# Patient Record
Sex: Male | Born: 1950 | Hispanic: No | Marital: Single | Smoking: Former smoker
Health system: Southern US, Community
[De-identification: ages and names within clinical notes are randomized; demographics above are authoritative.]

## PROBLEM LIST (undated history)

## (undated) DIAGNOSIS — J45909 Unspecified asthma, uncomplicated: Secondary | ICD-10-CM

## (undated) DIAGNOSIS — M1990 Unspecified osteoarthritis, unspecified site: Secondary | ICD-10-CM

## (undated) HISTORY — PX: ADENOIDECTOMY: SUR15

## (undated) HISTORY — DX: Unspecified osteoarthritis, unspecified site: M19.90

## (undated) HISTORY — PX: WISDOM TOOTH EXTRACTION: SHX21

## (undated) HISTORY — DX: Unspecified asthma, uncomplicated: J45.909

## (undated) HISTORY — PX: TONSILLECTOMY: SUR1361

---

## 2004-09-09 ENCOUNTER — Ambulatory Visit: Payer: Self-pay | Admitting: Family Medicine

## 2004-09-23 ENCOUNTER — Ambulatory Visit: Payer: Self-pay | Admitting: Family Medicine

## 2004-10-02 ENCOUNTER — Ambulatory Visit: Payer: Self-pay | Admitting: Family Medicine

## 2004-10-08 ENCOUNTER — Ambulatory Visit: Payer: Self-pay | Admitting: Gastroenterology

## 2005-08-23 ENCOUNTER — Ambulatory Visit: Payer: Self-pay | Admitting: Family Medicine

## 2009-02-13 ENCOUNTER — Encounter: Admission: RE | Admit: 2009-02-13 | Discharge: 2009-02-13 | Payer: Self-pay | Admitting: Chiropractic Medicine

## 2016-05-28 HISTORY — PX: DENTAL SURGERY: SHX609

## 2017-03-18 ENCOUNTER — Telehealth: Payer: Self-pay | Admitting: General Practice

## 2017-03-18 NOTE — Telephone Encounter (Signed)
Pt would like to know if you will accept him back as a pt? Pt cannot come in until after Jan 2019. Has not seen you since 2006. Pt is medicare.

## 2017-03-18 NOTE — Telephone Encounter (Signed)
Yes I can see him again

## 2017-03-24 NOTE — Telephone Encounter (Signed)
Pt has been scheduled.  °

## 2017-06-28 ENCOUNTER — Encounter: Payer: Self-pay | Admitting: Family Medicine

## 2017-06-28 ENCOUNTER — Ambulatory Visit: Payer: Medicare PPO | Admitting: Family Medicine

## 2017-06-28 VITALS — BP 144/98 | HR 77 | Temp 98.6°F | Resp 18 | Ht 76.0 in | Wt 311.2 lb

## 2017-06-28 DIAGNOSIS — N138 Other obstructive and reflux uropathy: Secondary | ICD-10-CM | POA: Diagnosis not present

## 2017-06-28 DIAGNOSIS — Z0000 Encounter for general adult medical examination without abnormal findings: Secondary | ICD-10-CM | POA: Diagnosis not present

## 2017-06-28 DIAGNOSIS — N401 Enlarged prostate with lower urinary tract symptoms: Secondary | ICD-10-CM | POA: Diagnosis not present

## 2017-06-28 DIAGNOSIS — I10 Essential (primary) hypertension: Secondary | ICD-10-CM | POA: Diagnosis not present

## 2017-06-29 ENCOUNTER — Other Ambulatory Visit (INDEPENDENT_AMBULATORY_CARE_PROVIDER_SITE_OTHER): Payer: Medicare PPO

## 2017-06-29 DIAGNOSIS — N138 Other obstructive and reflux uropathy: Secondary | ICD-10-CM | POA: Diagnosis not present

## 2017-06-29 DIAGNOSIS — N401 Enlarged prostate with lower urinary tract symptoms: Secondary | ICD-10-CM

## 2017-06-29 DIAGNOSIS — I10 Essential (primary) hypertension: Secondary | ICD-10-CM | POA: Diagnosis not present

## 2017-06-29 LAB — CBC WITH DIFFERENTIAL/PLATELET
Basophils Absolute: 0.1 10*3/uL (ref 0.0–0.1)
Basophils Relative: 1.5 % (ref 0.0–3.0)
Eosinophils Absolute: 0.1 10*3/uL (ref 0.0–0.7)
Eosinophils Relative: 1.7 % (ref 0.0–5.0)
HCT: 48 % (ref 39.0–52.0)
Hemoglobin: 16.2 g/dL (ref 13.0–17.0)
Lymphocytes Relative: 32.8 % (ref 12.0–46.0)
Lymphs Abs: 2.8 10*3/uL (ref 0.7–4.0)
MCHC: 33.7 g/dL (ref 30.0–36.0)
MCV: 93.4 fl (ref 78.0–100.0)
Monocytes Absolute: 0.7 10*3/uL (ref 0.1–1.0)
Monocytes Relative: 7.9 % (ref 3.0–12.0)
Neutro Abs: 4.7 10*3/uL (ref 1.4–7.7)
Neutrophils Relative %: 56.1 % (ref 43.0–77.0)
Platelets: 285 10*3/uL (ref 150.0–400.0)
RBC: 5.15 Mil/uL (ref 4.22–5.81)
RDW: 13.7 % (ref 11.5–15.5)
WBC: 8.4 10*3/uL (ref 4.0–10.5)

## 2017-06-29 LAB — POC URINALSYSI DIPSTICK (AUTOMATED)
Bilirubin, UA: NEGATIVE
Blood, UA: NEGATIVE
GLUCOSE UA: NEGATIVE
Ketones, UA: NEGATIVE
LEUKOCYTES UA: NEGATIVE
Nitrite, UA: NEGATIVE
Protein, UA: NEGATIVE
Spec Grav, UA: 1.025 (ref 1.010–1.025)
UROBILINOGEN UA: 0.2 U/dL
pH, UA: 6 (ref 5.0–8.0)

## 2017-06-29 LAB — BASIC METABOLIC PANEL
BUN: 20 mg/dL (ref 6–23)
CHLORIDE: 103 meq/L (ref 96–112)
CO2: 22 meq/L (ref 19–32)
CREATININE: 1.25 mg/dL (ref 0.40–1.50)
Calcium: 9.4 mg/dL (ref 8.4–10.5)
GFR: 61.48 mL/min (ref >60.00)
Glucose, Bld: 116 mg/dL — ABNORMAL HIGH (ref 70–99)
Potassium: 4.5 mEq/L (ref 3.5–5.1)
Sodium: 139 mEq/L (ref 135–145)

## 2017-06-29 LAB — HEPATIC FUNCTION PANEL
ALK PHOS: 78 U/L (ref 39–117)
ALT: 13 U/L (ref 0–53)
AST: 13 U/L (ref 0–37)
Albumin: 4.3 g/dL (ref 3.5–5.2)
Bilirubin, Direct: 0.2 mg/dL (ref 0.0–0.3)
Total Bilirubin: 1 mg/dL (ref 0.2–1.2)
Total Protein: 7.1 g/dL (ref 6.0–8.3)

## 2017-06-29 LAB — LIPID PANEL
CHOL/HDL RATIO: 3
Cholesterol: 182 mg/dL (ref 0–200)
HDL: 60.9 mg/dL (ref >39.00)
LDL CALC: 99 mg/dL (ref 0–99)
NonHDL: 120.92
TRIGLYCERIDES: 109 mg/dL (ref 0.0–149.0)
VLDL: 21.8 mg/dL (ref 0.0–40.0)

## 2017-06-29 LAB — TSH: TSH: 2.05 u[IU]/mL (ref 0.35–4.50)

## 2017-06-29 LAB — PSA: PSA: 2.31 ng/mL (ref 0.10–4.00)

## 2017-06-30 NOTE — Progress Notes (Signed)
   Subjective:    Patient ID: Jesse Chase, male    DOB: [birth year], 66 y.o.   MRN: [id number]  HPI Here to re-establish and for a health review.  We last saw him in 2007. He has been doing well. He has no specific complaints. He is past due for a colonoscopy.    Review of Systems  Constitutional: Negative.   HENT: Negative.   Eyes: Negative.   Respiratory: Negative.   Cardiovascular: Negative.   Gastrointestinal: Negative.   Genitourinary: Negative.   Musculoskeletal: Negative.   Skin: Negative.   Neurological: Negative.   Psychiatric/Behavioral: Negative.        Objective:   Physical Exam  Constitutional: He is oriented to person, place, and time. No distress.  Morbidly obese   HENT:  Head: Normocephalic and atraumatic.  Right Ear: External ear normal.  Left Ear: External ear normal.  Nose: Nose normal.  Mouth/Throat: Oropharynx is clear and moist. No oropharyngeal exudate.  Eyes: Conjunctivae and EOM are normal. Pupils are equal, round, and reactive to light. Right eye exhibits no discharge. Left eye exhibits no discharge. No scleral icterus.  Neck: Neck supple. No JVD present. No tracheal deviation present. No thyromegaly present.  Cardiovascular: Normal rate, regular rhythm, normal heart sounds and intact distal pulses. Exam reveals no gallop and no friction rub.  No murmur heard. Pulmonary/Chest: Effort normal and breath sounds normal. No respiratory distress. He has no wheezes. He has no rales. He exhibits no tenderness.  Abdominal: Soft. Bowel sounds are normal. He exhibits no distension and no mass. There is no tenderness. There is no rebound and no guarding.  Genitourinary: Rectum normal, prostate normal and penis normal. Rectal exam shows guaiac negative stool. No penile tenderness.  Musculoskeletal: Normal range of motion. He exhibits no edema or tenderness.  Lymphadenopathy:    He has no cervical adenopathy.  Neurological: He is alert and oriented to person,  place, and time. He has normal reflexes. No cranial nerve deficit. He exhibits normal muscle tone. Coordination normal.  Skin: Skin is warm and dry. No rash noted. He is not diaphoretic. No erythema. No pallor.  Psychiatric: He has a normal mood and affect. His behavior is normal. Judgment and thought content normal.          Assessment & Plan:  Re-establish visit and health review. . We discussed diet and exercise. Get fasting labs. Set up a colonoscopy.  Alysia Penna, MD

## 2017-07-22 ENCOUNTER — Telehealth: Payer: Self-pay | Admitting: Family Medicine

## 2017-07-22 NOTE — Telephone Encounter (Signed)
Copied from [place]. Topic: Quick Communication - Lab Results >> Jul 22, 2017 11:43 AM Burnis Medin, NT wrote: Patient called and said he had some lab work done and hadn't heard back . Pt would like a call back if results were in.

## 2017-07-22 NOTE — Telephone Encounter (Signed)
Informed pt that labs were all normal except his glucose is mildly elevated. Watch the carb intake. Pt verbalized understanding.

## 2017-07-22 NOTE — Telephone Encounter (Signed)
His labs were all normal except his glucose is mildly elevated. Watch the carb intake

## 2017-07-22 NOTE — Telephone Encounter (Signed)
Please advise 

## 2017-08-01 ENCOUNTER — Encounter: Payer: Self-pay | Admitting: Family Medicine

## 2017-10-13 ENCOUNTER — Telehealth: Payer: Self-pay | Admitting: Family Medicine

## 2017-10-13 DIAGNOSIS — M25561 Pain in right knee: Principal | ICD-10-CM

## 2017-10-13 DIAGNOSIS — G8929 Other chronic pain: Secondary | ICD-10-CM

## 2017-10-13 NOTE — Telephone Encounter (Signed)
Copied from [place] [id number]. Topic: Quick Communication - See Telephone Encounter >> Oct 13, 2017  4:17 PM [name], NT wrote: CRM for notification. See Telephone encounter for: 10/13/17. Patient calling and states that he was seen and had some preventative care done(06/28/17). Would like a copy of everything that was done, so he can send it to his life insurance and they can get him the preventative care check that he receives. CB#:[phone]

## 2017-10-13 NOTE — Telephone Encounter (Signed)
Copied from [place] [id number]. Topic: Referral - Request >> Oct 13, 2017  4:25 PM [name], [name] wrote: Reason for CRM: Patient states that when he was seen last, there was talk that an orthopedic referral would be done. Patient states that he is needing a referral done to orthopedics for his right knee. Please advise.

## 2017-10-14 NOTE — Telephone Encounter (Signed)
Sent to PCP to set up referral  

## 2017-10-14 NOTE — Telephone Encounter (Signed)
Called and spoke with pt. Have placed lab work and last OV summary to be mailed to pt as requested.

## 2017-10-17 NOTE — Telephone Encounter (Signed)
Referral was done  

## 2017-10-18 NOTE — Telephone Encounter (Signed)
Noted  

## 2017-10-20 ENCOUNTER — Encounter: Payer: Self-pay | Admitting: Gastroenterology

## 2017-10-25 ENCOUNTER — Ambulatory Visit (INDEPENDENT_AMBULATORY_CARE_PROVIDER_SITE_OTHER): Payer: Self-pay | Admitting: Orthopedic Surgery

## 2017-11-01 ENCOUNTER — Ambulatory Visit (INDEPENDENT_AMBULATORY_CARE_PROVIDER_SITE_OTHER): Payer: Medicare PPO | Admitting: Orthopaedic Surgery

## 2017-11-01 ENCOUNTER — Encounter (INDEPENDENT_AMBULATORY_CARE_PROVIDER_SITE_OTHER): Payer: Self-pay | Admitting: Orthopaedic Surgery

## 2017-11-01 ENCOUNTER — Ambulatory Visit (INDEPENDENT_AMBULATORY_CARE_PROVIDER_SITE_OTHER): Payer: Medicare PPO

## 2017-11-01 DIAGNOSIS — G8929 Other chronic pain: Secondary | ICD-10-CM | POA: Diagnosis not present

## 2017-11-01 DIAGNOSIS — M25561 Pain in right knee: Secondary | ICD-10-CM | POA: Diagnosis not present

## 2017-11-01 MED ORDER — BUPIVACAINE HCL 0.5 % IJ SOLN
2.0000 mL | INTRAMUSCULAR | Status: AC | PRN
  Administered 2017-11-01: 2 mL via INTRA_ARTICULAR

## 2017-11-01 MED ORDER — LIDOCAINE HCL 1 % IJ SOLN
2.0000 mL | INTRAMUSCULAR | Status: AC | PRN
  Administered 2017-11-01: 2 mL

## 2017-11-01 MED ORDER — METHYLPREDNISOLONE ACETATE 40 MG/ML IJ SUSP
40.0000 mg | INTRAMUSCULAR | Status: AC | PRN
  Administered 2017-11-01: 40 mg via INTRA_ARTICULAR

## 2017-11-01 MED ORDER — DICLOFENAC SODIUM 1 % TD GEL: 2.0000 g | Freq: Four times a day (QID) | TRANSDERMAL | 5 refills | Status: AC

## 2017-11-01 NOTE — Progress Notes (Signed)
Office Visit Note   Patient: Jesse Chase           Date of Birth: Aug 02, 1950           MRN: [id number] Visit Date: 11/01/2017              Requested by: Laurey Morale, MD [place], [place] [id number] PCP: Laurey Morale, MD   Assessment & Plan: Visit Diagnoses:  1. Chronic pain of right knee     Plan: Overall impression is right knee degenerative joint disease with narrowing of the medial compartment.  Cortisone injection performed today.  Prescription for Voltaren gel.  Patient instructed to contact us if he does not improve.    Follow-Up Instructions: Return if symptoms worsen or fail to improve.   Orders:  Orders Placed This Encounter  Procedures  . XR KNEE 3 VIEW RIGHT   Meds ordered this encounter  Medications  . diclofenac sodium (VOLTAREN) 1 % GEL    Sig: Apply 2 g topically 4 (four) times daily.    Dispense:  1 Tube    Refill:  5      Procedures: Large Joint Inj: R knee on 11/01/2017 3:36 PM Indications: pain Details: 22 G needle  Arthrogram: No  Medications: 40 mg methylPREDNISolone acetate 40 MG/ML; 2 mL lidocaine 1 %; 2 mL bupivacaine 0.5 % Consent was given by the patient. Patient was prepped and draped in the usual sterile fashion.       Clinical Data: No additional findings.   Subjective: Chief Complaint  Patient presents with  . Right Knee - Pain    Patient is a very pleasant 67 year old gentleman comes in with 2-year history of right knee pain that started when he fell directly on the knee.  He now has anteromedial joint line pain.  Denies any mechanical symptoms.  Denies any swelling.   Review of Systems  Constitutional: Negative.   All other systems reviewed and are negative.    Objective: Vital Signs: There were no vitals taken for this visit.  Physical Exam  Constitutional: He is oriented to person, place, and time. He appears well-developed and well-nourished.  HENT:  Head: Normocephalic and  atraumatic.  Eyes: Pupils are equal, round, and reactive to light.  Neck: Neck supple.  Pulmonary/Chest: Effort normal.  Abdominal: Soft.  Musculoskeletal: Normal range of motion.  Neurological: He is alert and oriented to person, place, and time.  Skin: Skin is warm.  Psychiatric: He has a normal mood and affect. His behavior is normal. Judgment and thought content normal.  Nursing note and vitals reviewed.   Ortho Exam Right knee exam shows no joint effusion.  Collaterals and cruciates are stable.  No significant joint line tenderness. Specialty Comments:  No specialty comments available.  Imaging: Xr Knee 3 View Right  Result Date: 11/01/2017 Medial compartment joint space narrowing >50%.    PMFS History: There are no active problems to display for this patient.  Past Medical History:  Diagnosis Date  . Asthma    Childhood "bronchial asthma"    History reviewed. No pertinent family history.  Past Surgical History:  Procedure Laterality Date  . ADENOIDECTOMY    . DENTAL SURGERY  05/28/2016   Bone graft from jaw for dental implants  . TONSILLECTOMY    . WISDOM TOOTH EXTRACTION     Social History   Occupational History  . Not on file  Tobacco Use  . Smoking status: Former Smoker  Last attempt to quit: 06/08/1991    Years since quitting: 26.4  . Smokeless tobacco: Never Used  Substance and Sexual Activity  . Alcohol use: Not on file  . Drug use: No  . Sexual activity: Yes

## 2017-12-06 ENCOUNTER — Ambulatory Visit (AMBULATORY_SURGERY_CENTER): Payer: Self-pay

## 2017-12-06 ENCOUNTER — Other Ambulatory Visit: Payer: Self-pay

## 2017-12-06 VITALS — Ht 76.0 in | Wt 302.8 lb

## 2017-12-06 DIAGNOSIS — Z1211 Encounter for screening for malignant neoplasm of colon: Secondary | ICD-10-CM

## 2017-12-06 MED ORDER — NA SULFATE-K SULFATE-MG SULF 17.5-3.13-1.6 GM/177ML PO SOLN: 1.0000 | Freq: Once | ORAL | 0 refills | Status: AC

## 2017-12-06 NOTE — Progress Notes (Signed)
Denies allergies to eggs or soy products. Denies complication of anesthesia or sedation. Denies use of weight loss medication. Denies use of O2.   Emmi instructions declined.  

## 2017-12-12 ENCOUNTER — Telehealth: Payer: Self-pay | Admitting: Gastroenterology

## 2017-12-12 NOTE — Telephone Encounter (Signed)
Spoke with patient. He states Supre cost $110 and he cannot afford that, he request alterative. I did explain to him the reason our Doctor uses this prep. Offered patient Suprep sample, he states he will come here today before 5pm on the 4 th floor and pick this up. Suprep sample (1 kit take as directed-lot # [id number]. 5/21) left at the front desk for the patient.

## 2017-12-12 NOTE — Telephone Encounter (Signed)
Called patient. No answer, left message for patient to call us back today before 5 pm.

## 2017-12-20 ENCOUNTER — Encounter: Payer: Self-pay | Admitting: Gastroenterology

## 2017-12-20 ENCOUNTER — Ambulatory Visit (AMBULATORY_SURGERY_CENTER): Payer: Medicare PPO | Admitting: Gastroenterology

## 2017-12-20 VITALS — BP 131/84 | HR 76 | Temp 96.6°F | Resp 22 | Ht 76.0 in | Wt 302.0 lb

## 2017-12-20 DIAGNOSIS — D124 Benign neoplasm of descending colon: Secondary | ICD-10-CM | POA: Diagnosis not present

## 2017-12-20 DIAGNOSIS — D125 Benign neoplasm of sigmoid colon: Secondary | ICD-10-CM

## 2017-12-20 DIAGNOSIS — K635 Polyp of colon: Secondary | ICD-10-CM

## 2017-12-20 DIAGNOSIS — Z1211 Encounter for screening for malignant neoplasm of colon: Secondary | ICD-10-CM

## 2017-12-20 MED ORDER — SODIUM CHLORIDE 0.9 % IV SOLN: 500.0000 mL | Freq: Once | INTRAVENOUS | Status: AC

## 2017-12-20 NOTE — Progress Notes (Signed)
Report to PACU, RN, vss, BBS= Clear.  

## 2017-12-20 NOTE — Patient Instructions (Signed)
YOU HAD AN ENDOSCOPIC PROCEDURE TODAY AT [facility]:   Refer to the procedure report that was given to you for any specific questions about what was found during the examination.  If the procedure report does not answer your questions, please call your gastroenterologist to clarify.  If you requested that your care partner not be given the details of your procedure findings, then the procedure report has been included in a sealed envelope for you to review at your convenience later.  YOU SHOULD EXPECT: Some feelings of bloating in the abdomen. Passage of more gas than usual.  Walking can help get rid of the air that was put into your GI tract during the procedure and reduce the bloating. If you had a lower endoscopy (such as a colonoscopy or flexible sigmoidoscopy) you may notice spotting of blood in your stool or on the toilet paper. If you underwent a bowel prep for your procedure, you may not have a normal bowel movement for a few days.  Please Note:  You might notice some irritation and congestion in your nose or some drainage.  This is from the oxygen used during your procedure.  There is no need for concern and it should clear up in a day or so.  SYMPTOMS TO REPORT IMMEDIATELY:   Following lower endoscopy (colonoscopy or flexible sigmoidoscopy):  Excessive amounts of blood in the stool  Significant tenderness or worsening of abdominal pains  Swelling of the abdomen that is new, acute  Fever of 100F or higher   For urgent or emergent issues, a gastroenterologist can be reached at any hour by calling [phone].   DIET:  We do recommend a small meal at first, but then you may proceed to your regular diet.  Drink plenty of fluids but you should avoid alcoholic beverages for 24 hours. We suggest a high fiber diet, and drink plenty of water.  ACTIVITY:  You should plan to take it easy for the rest of today and you should NOT DRIVE or use heavy machinery until tomorrow  (because of the sedation medicines used during the test).    FOLLOW UP: Our staff will call the number listed on your records the next business day following your procedure to check on you and address any questions or concerns that you may have regarding the information given to you following your procedure. If we do not reach you, we will leave a message.  However, if you are feeling well and you are not experiencing any problems, there is no need to return our call.  We will assume that you have returned to your regular daily activities without incident.  If any biopsies were taken you will be contacted by phone or by letter within the next 1-3 weeks.  Please call us at [phone] if you have not heard about the biopsies in 3 weeks.    SIGNATURES/CONFIDENTIALITY: You and/or your care partner have signed paperwork which will be entered into your electronic medical record.  These signatures attest to the fact that that the information above on your After Visit Summary has been reviewed and is understood.  Full responsibility of the confidentiality of this discharge information lies with you and/or your care-partner.  Read all of the handouts given to you by your recovery room nurse.

## 2017-12-20 NOTE — Progress Notes (Signed)
Pt's states no medical or surgical changes since previsit or office visit. 

## 2017-12-20 NOTE — Progress Notes (Signed)
Patient states that he is "not coming back for any more tests.   He states that he is not changing his diet, and he will be drinking alcohol whenever he wants.

## 2017-12-20 NOTE — Progress Notes (Signed)
Called to room to assist during endoscopic procedure.  Patient ID and intended procedure confirmed with present staff. Received instructions for my participation in the procedure from the performing physician.  

## 2017-12-20 NOTE — Op Note (Signed)
[facility] Patient Name: Jesse Chase Procedure Date: 12/20/2017 1:23 PM MRN: [id number] Endoscopist: Ladene Artist , MD Age: 67 Referring MD:  Date of Birth: 1951-01-07 Gender: Male Account #: [id number] Procedure:                Colonoscopy Indications:              Screening for colorectal malignant neoplasm Medicines:                Monitored Anesthesia Care Procedure:                Pre-Anesthesia Assessment:                           - Prior to the procedure, a History and Physical                            was performed, and patient medications and                            allergies were reviewed. The patient's tolerance of                            previous anesthesia was also reviewed. The risks                            and benefits of the procedure and the sedation                            options and risks were discussed with the patient.                            All questions were answered, and informed consent                            was obtained. Prior Anticoagulants: The patient has                            taken no previous anticoagulant or antiplatelet                            agents. ASA Grade Assessment: II - A patient with                            mild systemic disease. After reviewing the risks                            and benefits, the patient was deemed in                            satisfactory condition to undergo the procedure.                           After obtaining informed consent, the colonoscope  was passed under direct vision. Throughout the                            procedure, the patient's blood pressure, pulse, and                            oxygen saturations were monitored continuously. The                            Colonoscope was introduced through the anus and                            advanced to the the cecum, identified by                            appendiceal orifice and  ileocecal valve. The                            ileocecal valve, appendiceal orifice, and rectum                            were photographed. The quality of the bowel                            preparation was good. The colonoscopy was performed                            without difficulty. The patient tolerated the                            procedure well. Scope In: 1:27:40 PM Scope Out: 1:45:24 PM Scope Withdrawal Time: 0 hours 13 minutes 15 seconds  Total Procedure Duration: 0 hours 17 minutes 44 seconds  Findings:                 The perianal and digital rectal examinations were                            normal.                           Two sessile polyps were found in the sigmoid colon                            and descending colon. The polyps were 7 to 8 mm in                            size. These polyps were removed with a cold snare.                            Resection and retrieval were complete.                           Multiple medium-mouthed diverticula were found in  the left colon. There was narrowing of the colon in                            association with the diverticular opening. There                            was evidence of an impacted diverticulum. There was                            no evidence of diverticular bleeding.                           Internal hemorrhoids were found during                            retroflexion. The hemorrhoids were small and Grade                            I (internal hemorrhoids that do not prolapse).                           The exam was otherwise without abnormality on                            direct and retroflexion views. Complications:            No immediate complications. Estimated blood loss:                            None. Estimated Blood Loss:     Estimated blood loss: none. Impression:               - Two 7 to 8 mm polyps in the sigmoid colon and in                            the  descending colon, removed with a cold snare.                            Resected and retrieved.                           - Moderate diverticulosis in the left colon.                           - Small internal hemorrhoids. Recommendation:           - Repeat colonoscopy in 5 years for surveillance if                            polyp(s) are precancerous, otherwise 10 years.                           - Patient has a contact number available for  emergencies. The signs and symptoms of potential                            delayed complications were discussed with the                            patient. Return to normal activities tomorrow.                            Written discharge instructions were provided to the                            patient.                           - High fiber diet.                           - Continue present medications.                           - Await pathology results. Ladene Artist, MD 12/20/2017 1:51:26 PM This report has been signed electronically.

## 2017-12-21 ENCOUNTER — Telehealth: Payer: Self-pay | Admitting: *Deleted

## 2017-12-21 ENCOUNTER — Telehealth: Payer: Self-pay

## 2017-12-21 NOTE — Telephone Encounter (Signed)
  Follow up Call-  Call back number 12/20/2017  Post procedure Call Back phone  # [phone]  Permission to leave phone message Yes  Some recent data might be hidden     Patient questions:  Do you have a fever, pain , or abdominal swelling? No. Pain Score  0 *  Have you tolerated food without any problems? Yes.    Have you been able to return to your normal activities? Yes.    Do you have any questions about your discharge instructions: Diet   No. Medications  No. Follow up visit  No.  Do you have questions or concerns about your Care? No.  Actions: * If pain score is 4 or above: No action needed, pain <4.

## 2017-12-21 NOTE — Telephone Encounter (Signed)
Attempted to reach patient for post-procedure f/u call. No answer. Left message that we will make another attempt to reach him later today and for him to please not hesitate to call us if he has any questions/concerns regarding his care. 

## 2017-12-27 ENCOUNTER — Encounter: Payer: Self-pay | Admitting: Gastroenterology

## 2018-02-10 DIAGNOSIS — M79675 Pain in left toe(s): Secondary | ICD-10-CM | POA: Diagnosis not present

## 2018-02-10 DIAGNOSIS — B079 Viral wart, unspecified: Secondary | ICD-10-CM | POA: Diagnosis not present

## 2018-02-10 DIAGNOSIS — M79674 Pain in right toe(s): Secondary | ICD-10-CM | POA: Diagnosis not present

## 2018-02-24 DIAGNOSIS — M71371 Other bursal cyst, right ankle and foot: Secondary | ICD-10-CM | POA: Diagnosis not present

## 2018-02-24 DIAGNOSIS — B079 Viral wart, unspecified: Secondary | ICD-10-CM | POA: Diagnosis not present

## 2018-02-28 ENCOUNTER — Ambulatory Visit (INDEPENDENT_AMBULATORY_CARE_PROVIDER_SITE_OTHER): Payer: Medicare PPO | Admitting: Orthopaedic Surgery

## 2018-02-28 ENCOUNTER — Encounter (INDEPENDENT_AMBULATORY_CARE_PROVIDER_SITE_OTHER): Payer: Self-pay | Admitting: Orthopaedic Surgery

## 2018-02-28 DIAGNOSIS — M1731 Unilateral post-traumatic osteoarthritis, right knee: Secondary | ICD-10-CM | POA: Diagnosis not present

## 2018-02-28 MED ORDER — BUPIVACAINE HCL 0.25 % IJ SOLN
2.0000 mL | INTRAMUSCULAR | Status: AC | PRN
Start: 2018-02-28 — End: 2018-02-28
  Administered 2018-02-28: 2 mL via INTRA_ARTICULAR

## 2018-02-28 MED ORDER — LIDOCAINE HCL 1 % IJ SOLN
2.0000 mL | INTRAMUSCULAR | Status: AC | PRN
  Administered 2018-02-28: 2 mL

## 2018-02-28 MED ORDER — METHYLPREDNISOLONE ACETATE 40 MG/ML IJ SUSP
40.0000 mg | INTRAMUSCULAR | Status: AC | PRN
  Administered 2018-02-28: 40 mg via INTRA_ARTICULAR

## 2018-02-28 NOTE — Progress Notes (Signed)
Office Visit Note   Patient: Jesse Chase           Date of Birth: February 26, 1951           MRN: [id number] Visit Date: 02/28/2018              Requested by: Laurey Morale, MD [place], [place] [id number] PCP: Laurey Morale, MD   Assessment & Plan: Visit Diagnoses:  1. Post-traumatic osteoarthritis of right knee     Plan: Impression is right knee osteoarthritis with a degenerative medial meniscus tear.  We will reinject the right knee with cortisone today.  We have discussed Visco supplementation injection versus MRI to assess the medial meniscus.  We will wait and see how the cortisone injection does.  He would like to check with insurance to see how much the MRI would cost.  He will let us know if he would like to proceed.  Otherwise, follow-up with [place] as needed.  Follow-Up Instructions: Return if symptoms worsen or fail to improve.   Orders:  Orders Placed This Encounter  Procedures  . Large Joint Inj: R knee   No orders of the defined types were placed in this encounter.     Procedures: Large Joint Inj: R knee on 02/28/2018 2:28 PM Indications: pain Details: 22 G needle, anterolateral approach Medications: 2 mL lidocaine 1 %; 2 mL bupivacaine 0.25 %; 40 mg methylPREDNISolone acetate 40 MG/ML      Clinical Data: No additional findings.   Subjective: Chief Complaint  Patient presents with  . Right Knee - Pain    HPI patient is a pleasant 67 year old gentleman who presents to our clinic today with recurrent right knee pain.  This is been ongoing for the past 3 years following an accident when he landed on a hyperflexed right knee.  The pain he experiences all the medial aspect.  He has been having sharp shooting pain primarily with pivoting.  He was seen in our office approximately 4 months ago where he was given a cortisone injection.  This significantly helped until recently.  His pain has started to return, however it does not to the point where  it was prior to the injection.  He has a high school reunion coming up next week and would like to make sure he has no pain for that event.  Review of Systems as detailed in HPI.  All others reviewed and are negative.   Objective: Vital Signs: There were no vitals taken for this visit.  Physical Exam well-developed well-nourished gentleman in no acute distress.  Alert and oriented x3.  Ortho Exam examination of the right knee reveals range of motion from 0 to 125 degrees.  Mild patellofemoral crepitus.  Moderate medial joint line tenderness.  Ligaments are stable.  Specialty Comments:  No specialty comments available.  Imaging: X-rays of the right knee reviewed by me in canopy reveal moderate medial and patellofemoral compartment degenerative changes   PMFS History: Patient Active Problem List   Diagnosis Date Noted  . Post-traumatic osteoarthritis of right knee 02/28/2018   Past Medical History:  Diagnosis Date  . Arthritis   . Asthma    Childhood "bronchial asthma"    Family History  Problem Relation Age of Onset  . Colon cancer Neg Hx   . Esophageal cancer Neg Hx   . Liver cancer Neg Hx   . Pancreatic cancer Neg Hx   . Rectal cancer Neg Hx   . Stomach  cancer Neg Hx     Past Surgical History:  Procedure Laterality Date  . ADENOIDECTOMY    . DENTAL SURGERY  05/28/2016   Bone graft from jaw for dental implants  . TONSILLECTOMY    . WISDOM TOOTH EXTRACTION     Social History   Occupational History  . Not on file  Tobacco Use  . Smoking status: Former Smoker    Last attempt to quit: 06/08/1991    Years since quitting: 26.7  . Smokeless tobacco: Never Used  Substance and Sexual Activity  . Alcohol use: Yes    Comment: Drinks on a regular basis socially  . Drug use: Yes    Types: Marijuana  . Sexual activity: Yes

## 2018-07-28 DIAGNOSIS — L820 Inflamed seborrheic keratosis: Secondary | ICD-10-CM | POA: Diagnosis not present

## 2018-07-28 DIAGNOSIS — D225 Melanocytic nevi of trunk: Secondary | ICD-10-CM | POA: Diagnosis not present

## 2018-07-28 DIAGNOSIS — Z1283 Encounter for screening for malignant neoplasm of skin: Secondary | ICD-10-CM | POA: Diagnosis not present

## 2018-07-28 DIAGNOSIS — B351 Tinea unguium: Secondary | ICD-10-CM | POA: Diagnosis not present

## 2018-08-08 ENCOUNTER — Ambulatory Visit (INDEPENDENT_AMBULATORY_CARE_PROVIDER_SITE_OTHER): Payer: Medicare PPO | Admitting: Orthopaedic Surgery

## 2018-08-08 ENCOUNTER — Encounter (INDEPENDENT_AMBULATORY_CARE_PROVIDER_SITE_OTHER): Payer: Self-pay | Admitting: Orthopaedic Surgery

## 2018-08-08 DIAGNOSIS — M1731 Unilateral post-traumatic osteoarthritis, right knee: Secondary | ICD-10-CM

## 2018-08-08 MED ORDER — METHYLPREDNISOLONE ACETATE 40 MG/ML IJ SUSP
40.0000 mg | INTRAMUSCULAR | Status: AC | PRN
  Administered 2018-08-08: 40 mg via INTRA_ARTICULAR

## 2018-08-08 MED ORDER — BUPIVACAINE HCL 0.5 % IJ SOLN
2.0000 mL | INTRAMUSCULAR | Status: AC | PRN
  Administered 2018-08-08: 2 mL via INTRA_ARTICULAR

## 2018-08-08 MED ORDER — LIDOCAINE HCL 1 % IJ SOLN
2.0000 mL | INTRAMUSCULAR | Status: AC | PRN
Start: 2018-08-08 — End: 2018-08-08
  Administered 2018-08-08: 2 mL

## 2018-08-08 NOTE — Progress Notes (Signed)
Office Visit Note   Patient: Jesse Chase           Date of Birth: [birth year]           MRN: [id number] Visit Date: 08/08/2018              Requested by: Laurey Morale, MD [place], [place] [id number] PCP: Laurey Morale, MD   Assessment & Plan: Visit Diagnoses:  1. Post-traumatic osteoarthritis of right knee     Plan: Impression is continued right knee pain with 2 months relief from cortisone injection.  His pain seems to be more localized to the medial side.  We reinjected his knee today.  He has been using diclofenac gel which has helped.  At this point I would like to obtain MRI to rule out structural abnormalities particularly a medial meniscus tear.  I will see him back in about a week and a half to review the MRI.  Follow-Up Instructions: Return in about 10 days (around 08/18/2018).   Orders:  Orders Placed This Encounter  Procedures  . MR Knee Right w/o contrast   No orders of the defined types were placed in this encounter.     Procedures: Large Joint Inj: R knee on 08/08/2018 4:14 PM Indications: pain Details: 22 G needle  Arthrogram: No  Medications: 40 mg methylPREDNISolone acetate 40 MG/ML; 2 mL lidocaine 1 %; 2 mL bupivacaine 0.5 % Outcome: tolerated well, no immediate complications Consent was given by the patient. Patient was prepped and draped in the usual sterile fashion.       Clinical Data: No additional findings.   Subjective: Chief Complaint  Patient presents with  . Right Knee - Pain    [name] follows up today for continued right knee pain.  Previous cortisone injection in September gave him approximately 2 months of relief.  He still has medial sided knee pain.  It is worse when he is ambulating and walking.   Review of Systems  Constitutional: Negative.   All other systems reviewed and are negative.    Objective: Vital Signs: There were no vitals taken for this visit.  Physical Exam Vitals signs and  nursing note reviewed.  Constitutional:      Appearance: He is well-developed.  Pulmonary:     Effort: Pulmonary effort is normal.  Abdominal:     Palpations: Abdomen is soft.  Skin:    General: Skin is warm.  Neurological:     Mental Status: He is alert and oriented to person, place, and time.  Psychiatric:        Behavior: Behavior normal.        Thought Content: Thought content normal.        Judgment: Judgment normal.     Ortho Exam Right knee exam shows no joint effusion.  Unchanged and unremarkable. Specialty Comments:  No specialty comments available.  Imaging: No results found.   PMFS History: Patient Active Problem List   Diagnosis Date Noted  . Post-traumatic osteoarthritis of right knee 02/28/2018   Past Medical History:  Diagnosis Date  . Arthritis   . Asthma    Childhood "bronchial asthma"    Family History  Problem Relation Age of Onset  . Colon cancer Neg Hx   . Esophageal cancer Neg Hx   . Liver cancer Neg Hx   . Pancreatic cancer Neg Hx   . Rectal cancer Neg Hx   . Stomach cancer Neg Hx  Past Surgical History:  Procedure Laterality Date  . ADENOIDECTOMY    . DENTAL SURGERY  05/28/2016   Bone graft from jaw for dental implants  . TONSILLECTOMY    . WISDOM TOOTH EXTRACTION     Social History   Occupational History  . Not on file  Tobacco Use  . Smoking status: Former Smoker    Last attempt to quit: 06/08/1991    Years since quitting: 27.1  . Smokeless tobacco: Never Used  Substance and Sexual Activity  . Alcohol use: Yes    Comment: Drinks on a regular basis socially  . Drug use: Yes    Types: Marijuana  . Sexual activity: Yes

## 2018-08-24 ENCOUNTER — Telehealth (INDEPENDENT_AMBULATORY_CARE_PROVIDER_SITE_OTHER): Payer: Self-pay

## 2018-08-24 ENCOUNTER — Ambulatory Visit
Admission: RE | Admit: 2018-08-24 | Discharge: 2018-08-24 | Disposition: A | Discharge status: Home / Self Care | Payer: Medicare PPO | Source: Ambulatory Visit | Attending: Orthopaedic Surgery | Admitting: Orthopaedic Surgery

## 2018-08-24 ENCOUNTER — Other Ambulatory Visit: Payer: Self-pay

## 2018-08-24 DIAGNOSIS — M25561 Pain in right knee: Secondary | ICD-10-CM | POA: Diagnosis not present

## 2018-08-24 DIAGNOSIS — M1731 Unilateral post-traumatic osteoarthritis, right knee: Secondary | ICD-10-CM

## 2018-08-24 NOTE — Telephone Encounter (Signed)
Leandrew Koyanagi, MD  Precious Bard, RMA        Needs f/u appt     Called patient no answer. Just needs an appointment to review MRI RESULTS with Dr. Erlinda Hong.

## 2018-08-24 NOTE — Progress Notes (Signed)
Needs f/u appt 

## 2018-08-30 ENCOUNTER — Other Ambulatory Visit: Payer: Self-pay

## 2018-08-30 ENCOUNTER — Encounter (INDEPENDENT_AMBULATORY_CARE_PROVIDER_SITE_OTHER): Payer: Self-pay | Admitting: Orthopaedic Surgery

## 2018-08-30 ENCOUNTER — Ambulatory Visit (INDEPENDENT_AMBULATORY_CARE_PROVIDER_SITE_OTHER): Payer: Medicare PPO | Admitting: Orthopaedic Surgery

## 2018-08-30 DIAGNOSIS — Z712 Person consulting for explanation of examination or test findings: Secondary | ICD-10-CM | POA: Diagnosis not present

## 2018-08-30 DIAGNOSIS — M1731 Unilateral post-traumatic osteoarthritis, right knee: Secondary | ICD-10-CM | POA: Diagnosis not present

## 2018-08-30 NOTE — Progress Notes (Signed)
Office Visit Note   Patient: Jesse Chase           Date of Birth: 10-28-1950           MRN: [id number] Visit Date: 08/30/2018              Requested by: Laurey Morale, MD [place],  [id number] PCP: Laurey Morale, MD   Assessment & Plan: Visit Diagnoses:  1. Post-traumatic osteoarthritis of right knee     Plan: MRI findings are consistent with moderately severe medial compartment degenerative joint disease as well as a complex tear of the medial meniscus and lateral meniscus.  The medial meniscus appears to be still functional.  We had a lengthy discussion today regarding treatment options that include serial cortisone injections, arthroscopic debridement, eventual knee replacement once his DJD is more severe.  All questions answered to his satisfaction today.  He would like [place] to contact him regarding potential cost of arthroscopic knee surgery which we will certainly do so.  As for now we will see him back when he needs another cortisone injection. Total face to face encounter time was greater than 25 minutes and over half of this time was spent in counseling and/or coordination of care.  Follow-Up Instructions: Return if symptoms worsen or fail to improve.   Orders:  No orders of the defined types were placed in this encounter.  No orders of the defined types were placed in this encounter.     Procedures: No procedures performed   Clinical Data: No additional findings.   Subjective: Chief Complaint  Patient presents with  . Right Knee - Pain    [name] follows up today for review of his right knee MRI.  He has had 3 cortisone injections since May of last year with each injection giving him at least 3 to 4 months of relief.  He states that the pain is primarily on the medial side that is worse with getting up from chair and going up and down stairs.  The pain is significant.   Review of Systems   Objective: Vital Signs: There were no  vitals taken for this visit.  Physical Exam  Ortho Exam Right knee exam shows medial joint line tenderness.  Collaterals and cruciates are stable.  Trace joint effusion. Specialty Comments:  No specialty comments available.  Imaging: No results found.   PMFS History: Patient Active Problem List   Diagnosis Date Noted  . Post-traumatic osteoarthritis of right knee 02/28/2018   Past Medical History:  Diagnosis Date  . Arthritis   . Asthma    Childhood "bronchial asthma"    Family History  Problem Relation Age of Onset  . Colon cancer Neg Hx   . Esophageal cancer Neg Hx   . Liver cancer Neg Hx   . Pancreatic cancer Neg Hx   . Rectal cancer Neg Hx   . Stomach cancer Neg Hx     Past Surgical History:  Procedure Laterality Date  . ADENOIDECTOMY    . DENTAL SURGERY  05/28/2016   Bone graft from jaw for dental implants  . TONSILLECTOMY    . WISDOM TOOTH EXTRACTION     Social History   Occupational History  . Not on file  Tobacco Use  . Smoking status: Former Smoker    Last attempt to quit: 06/08/1991    Years since quitting: 27.2  . Smokeless tobacco: Never Used  Substance and Sexual Activity  . Alcohol use:  Yes    Comment: Drinks on a regular basis socially  . Drug use: Yes    Types: Marijuana  . Sexual activity: Yes

## 2018-11-08 ENCOUNTER — Other Ambulatory Visit: Payer: Self-pay

## 2018-11-08 ENCOUNTER — Ambulatory Visit: Payer: Medicare PPO | Admitting: Orthopaedic Surgery

## 2018-11-08 ENCOUNTER — Encounter: Payer: Self-pay | Admitting: Orthopaedic Surgery

## 2018-11-08 DIAGNOSIS — S83231A Complex tear of medial meniscus, current injury, right knee, initial encounter: Secondary | ICD-10-CM | POA: Insufficient documentation

## 2018-11-08 DIAGNOSIS — S83231D Complex tear of medial meniscus, current injury, right knee, subsequent encounter: Secondary | ICD-10-CM

## 2018-11-08 DIAGNOSIS — M1731 Unilateral post-traumatic osteoarthritis, right knee: Secondary | ICD-10-CM | POA: Diagnosis not present

## 2018-11-08 DIAGNOSIS — S83279D Complex tear of lateral meniscus, current injury, unspecified knee, subsequent encounter: Secondary | ICD-10-CM

## 2018-11-08 MED ORDER — BUPIVACAINE HCL 0.5 % IJ SOLN
2.0000 mL | INTRAMUSCULAR | Status: AC | PRN
  Administered 2018-11-08: 2 mL via INTRA_ARTICULAR

## 2018-11-08 MED ORDER — METHYLPREDNISOLONE ACETATE 40 MG/ML IJ SUSP
40.0000 mg | INTRAMUSCULAR | Status: AC | PRN
  Administered 2018-11-08: 40 mg via INTRA_ARTICULAR

## 2018-11-08 MED ORDER — LIDOCAINE HCL 1 % IJ SOLN
2.0000 mL | INTRAMUSCULAR | Status: AC | PRN
  Administered 2018-11-08: 2 mL

## 2018-11-08 NOTE — Progress Notes (Signed)
Office Visit Note   Patient: Jesse Chase           Date of Birth: 12/22/1950           MRN: [id number] Visit Date: 11/08/2018              Requested by: Laurey Morale, MD [place], [place] [id number] PCP: Laurey Morale, MD   Assessment & Plan: Visit Diagnoses:  1. Post-traumatic osteoarthritis of right knee   2. Complex tear of medial meniscus of right knee as current injury, subsequent encounter   3. Complex tear of lateral meniscus of knee as current injury, subsequent encounter     Plan: Today we repeated the cortisone injection into his right knee.  Patient tolerated this well.  I have also provided him with the relevant surgical CPT codes so that he can inquire about the cost with his insurance company.  Follow-Up Instructions: Return if symptoms worsen or fail to improve.   Orders:  No orders of the defined types were placed in this encounter.  No orders of the defined types were placed in this encounter.     Procedures: Large Joint Inj: R knee on 11/08/2018 3:50 PM Indications: pain Details: 22 G needle  Arthrogram: No  Medications: 40 mg methylPREDNISolone acetate 40 MG/ML; 2 mL lidocaine 1 %; 2 mL bupivacaine 0.5 % Consent was given by the patient. Patient was prepped and draped in the usual sterile fashion.       Clinical Data: No additional findings.   Subjective: Chief Complaint  Patient presents with  . Right Knee - Pain    Jesse Chase returns today for another right knee injection.  He previously had it about 3 months ago.  He has had an MRI which showed some mild chondromalacia and medial lateral meniscus tears.  His pain has started to return.  He did get good relief from the previous injection so he would like another one today.   Review of Systems   Objective: Vital Signs: There were no vitals taken for this visit.  Physical Exam  Ortho Exam Right knee exam is stable. Specialty Comments:  No specialty comments  available.  Imaging: No results found.   PMFS History: Patient Active Problem List   Diagnosis Date Noted  . Complex tear of medial meniscus of right knee as current injury 11/08/2018  . Complex tear of lateral meniscus of knee as current injury, subsequent encounter 11/08/2018  . Post-traumatic osteoarthritis of right knee 02/28/2018   Past Medical History:  Diagnosis Date  . Arthritis   . Asthma    Childhood "bronchial asthma"    Family History  Problem Relation Age of Onset  . Colon cancer Neg Hx   . Esophageal cancer Neg Hx   . Liver cancer Neg Hx   . Pancreatic cancer Neg Hx   . Rectal cancer Neg Hx   . Stomach cancer Neg Hx     Past Surgical History:  Procedure Laterality Date  . ADENOIDECTOMY    . DENTAL SURGERY  05/28/2016   Bone graft from jaw for dental implants  . TONSILLECTOMY    . WISDOM TOOTH EXTRACTION     Social History   Occupational History  . Not on file  Tobacco Use  . Smoking status: Former Smoker    Last attempt to quit: 06/08/1991    Years since quitting: 27.4  . Smokeless tobacco: Never Used  Substance and Sexual Activity  . Alcohol use:  Yes    Comment: Drinks on a regular basis socially  . Drug use: Yes    Types: Marijuana  . Sexual activity: Yes

## 2019-02-01 ENCOUNTER — Other Ambulatory Visit: Payer: Self-pay

## 2019-02-01 ENCOUNTER — Ambulatory Visit (INDEPENDENT_AMBULATORY_CARE_PROVIDER_SITE_OTHER): Payer: Medicare PPO | Admitting: Adult Health

## 2019-02-01 ENCOUNTER — Encounter: Payer: Self-pay | Admitting: Adult Health

## 2019-02-01 VITALS — BP 126/84 | Temp 97.9°F | Wt 289.0 lb

## 2019-02-01 DIAGNOSIS — L0291 Cutaneous abscess, unspecified: Secondary | ICD-10-CM | POA: Diagnosis not present

## 2019-02-01 MED ORDER — DOXYCYCLINE HYCLATE 100 MG PO CAPS: 100.0000 mg | ORAL_CAPSULE | Freq: Two times a day (BID) | ORAL | 0 refills | Status: AC

## 2019-02-01 MED ORDER — OXYCODONE-ACETAMINOPHEN 10-325 MG PO TABS: 1.0000 | ORAL_TABLET | Freq: Three times a day (TID) | ORAL | 0 refills | Status: AC | PRN

## 2019-02-02 NOTE — Progress Notes (Signed)
Subjective:    Patient ID: Jesse Chase, male    DOB: January 11, 1951, 68 y.o.   MRN: [id number]  HPI 68 year old male who  has a past medical history of Arthritis and Asthma.  He is a patient of Dr. Sarajane Jews who I am seeing today for an acute on chronic issue of recurrent abscess. He reports having to have multiple I&D's in the past. Over the last 2-3 days he has noticed pain and discharge ": between my balls and my butthole". He reports that his wife was able to " squeeze it and get some pus out of it".    Review of Systems See HPI   Past Medical History:  Diagnosis Date  . Arthritis   . Asthma    Childhood "bronchial asthma"    Social History   Socioeconomic History  . Marital status: Single    Spouse name: Not on file  . Number of children: Not on file  . Years of education: Not on file  . Highest education level: Not on file  Occupational History  . Not on file  Social Needs  . Financial resource strain: Not on file  . Food insecurity    Worry: Not on file    Inability: Not on file  . Transportation needs    Medical: Not on file    Non-medical: Not on file  Tobacco Use  . Smoking status: Former Smoker    Quit date: 06/08/1991    Years since quitting: 27.6  . Smokeless tobacco: Never Used  Substance and Sexual Activity  . Alcohol use: Yes    Comment: Drinks on a regular basis socially  . Drug use: Yes    Types: Marijuana  . Sexual activity: Yes  Lifestyle  . Physical activity    Days per week: Not on file    Minutes per session: Not on file  . Stress: Not on file  Relationships  . Social Herbalist on phone: Not on file    Gets together: Not on file    Attends religious service: Not on file    Active member of club or organization: Not on file    Attends meetings of clubs or organizations: Not on file    Relationship status: Not on file  . Intimate partner violence    Fear of current or ex partner: Not on file    Emotionally abused: Not on file    Physically abused: Not on file    Forced sexual activity: Not on file  Other Topics Concern  . Not on file  Social History Narrative  . Not on file    Past Surgical History:  Procedure Laterality Date  . ADENOIDECTOMY    . DENTAL SURGERY  05/28/2016   Bone graft from jaw for dental implants  . TONSILLECTOMY    . WISDOM TOOTH EXTRACTION      Family History  Problem Relation Age of Onset  . Colon cancer Neg Hx   . Esophageal cancer Neg Hx   . Liver cancer Neg Hx   . Pancreatic cancer Neg Hx   . Rectal cancer Neg Hx   . Stomach cancer Neg Hx     Allergies  Allergen Reactions  . Penicillins Rash    Reaction as a child.    Current Outpatient Medications on File Prior to Visit  Medication Sig Dispense Refill  . diclofenac sodium (VOLTAREN) 1 % GEL Apply 2 g topically 4 (four) times daily. 1  Tube 5   Current Facility-Administered Medications on File Prior to Visit  Medication Dose Route Frequency Provider Last Rate Last Dose  . 0.9 %  sodium chloride infusion  500 mL Intravenous Once Ladene Artist, MD        BP 126/84 (Patient Position: Standing)   Temp 97.9 F (36.6 C)   Wt 289 lb (131.1 kg)   BMI 35.18 kg/m       Objective:   Physical Exam Vitals signs and nursing note reviewed.  Constitutional:      Appearance: Normal appearance.  Cardiovascular:     Rate and Rhythm: Normal rate and regular rhythm.     Pulses: Normal pulses.     Heart sounds: Normal heart sounds.  Pulmonary:     Effort: Pulmonary effort is normal.     Breath sounds: Normal breath sounds.  Genitourinary:   Skin:    Capillary Refill: Capillary refill takes less than 2 seconds.     Findings: Erythema present.     Comments: Trauma abscess noted with purulent discharge  Neurological:     General: No focal deficit present.     Mental Status: He is alert and oriented to person, place, and time.  Psychiatric:        Mood and Affect: Mood normal.        Behavior: Behavior normal.         Thought Content: Thought content normal.        Judgment: Judgment normal.       Assessment & Plan:  1. Abscess Procedure:  Incision and drainage of abscess Risks, benefits, and alternatives explained and consent obtained. Time out conducted. Surface cleaned with alcohol and betadine  2 ml cc lidocaine without epinephine infiltrated around abscess. Adequate anesthesia ensured. Area prepped and draped in a sterile fashion. #11 blade used to make a stab incision into abscess. Pus expressed with pressure. Curved hemostat used to explore 4 quadrants and loculations broken up. Further purulence expressed.  0.5 inches of iodoform packing placed leaving a 1-inch tail. Hemostasis achieved. Pt stable. Aftercare and follow-up advised.  - doxycycline (VIBRAMYCIN) 100 MG capsule; Take 1 capsule (100 mg total) by mouth 2 (two) times daily for 14 days.  Dispense: 28 capsule; Refill: 0 - oxyCODONE-acetaminophen (PERCOCET) 10-325 MG tablet; Take 1 tablet by mouth every 8 (eight) hours as needed for up to 5 days for pain.  Dispense: 10 tablet; Refill: 0 - Body fluid culture  Dorothyann Peng, NP

## 2019-02-06 LAB — BODY FLUID CULTURE

## 2019-03-13 ENCOUNTER — Encounter: Payer: Self-pay | Admitting: Orthopaedic Surgery

## 2019-03-13 ENCOUNTER — Ambulatory Visit (INDEPENDENT_AMBULATORY_CARE_PROVIDER_SITE_OTHER): Payer: Medicare PPO | Admitting: Orthopaedic Surgery

## 2019-03-13 DIAGNOSIS — M1711 Unilateral primary osteoarthritis, right knee: Secondary | ICD-10-CM

## 2019-03-13 MED ORDER — METHYLPREDNISOLONE ACETATE 40 MG/ML IJ SUSP
40.0000 mg | INTRAMUSCULAR | Status: AC | PRN
  Administered 2019-03-13: 15:00:00 40 mg via INTRA_ARTICULAR

## 2019-03-13 MED ORDER — BUPIVACAINE HCL 0.25 % IJ SOLN
2.0000 mL | INTRAMUSCULAR | Status: AC | PRN
  Administered 2019-03-13: 2 mL via INTRA_ARTICULAR

## 2019-03-13 MED ORDER — LIDOCAINE HCL 1 % IJ SOLN
2.0000 mL | INTRAMUSCULAR | Status: AC | PRN
  Administered 2019-03-13: 2 mL

## 2019-03-13 NOTE — Progress Notes (Signed)
Office Visit Note   Patient: Jesse Chase           Date of Birth: January 16, 1951           MRN: [id number] Visit Date: 03/13/2019              Requested by: Laurey Morale, MD [place],  [place] [id number] PCP: Laurey Morale, MD   Assessment & Plan: Visit Diagnoses:  1. Unilateral primary osteoarthritis, right knee     Plan: Impression is right knee osteoarthritis medial lateral meniscus tears.  We will reinject the right knee with cortisone today.  He will follow-up with [place] as needed.  Follow-Up Instructions: Return if symptoms worsen or fail to improve.   Orders:  Orders Placed This Encounter  Procedures  . Large Joint Inj: R knee   No orders of the defined types were placed in this encounter.     Procedures: Large Joint Inj: R knee on 03/13/2019 2:56 PM Indications: pain Details: 22 G needle, anterolateral approach Medications: 2 mL bupivacaine 0.25 %; 2 mL lidocaine 1 %; 40 mg methylPREDNISolone acetate 40 MG/ML      Clinical Data: No additional findings.   Subjective: Chief Complaint  Patient presents with  . Right Knee - Pain    HPI patient is a pleasant 68 year old gentleman with a history of right knee osteoarthritis and medial lateral meniscus tears who presents our clinic today with recurrent right knee pain.  He has been getting cortisone injections approximately every 4 months.  His last injection was on 11/08/2018 which provided significant relief until about 5 days ago.  He comes in requesting another injection.     Objective: Vital Signs: There were no vitals taken for this visit.   Ortho Exam examination of his right knee shows no effusion.  Range of motion 0 to 120 degrees.  Medial joint line tenderness.  Ligaments are stable.  He is neurovascularly intact distally.  Specialty Comments:  No specialty comments available.  Imaging: No new imaging   PMFS History: Patient Active Problem List   Diagnosis Date Noted  .  Complex tear of medial meniscus of right knee as current injury 11/08/2018  . Complex tear of lateral meniscus of knee as current injury, subsequent encounter 11/08/2018  . Post-traumatic osteoarthritis of right knee 02/28/2018   Past Medical History:  Diagnosis Date  . Arthritis   . Asthma    Childhood "bronchial asthma"    Family History  Problem Relation Age of Onset  . Colon cancer Neg Hx   . Esophageal cancer Neg Hx   . Liver cancer Neg Hx   . Pancreatic cancer Neg Hx   . Rectal cancer Neg Hx   . Stomach cancer Neg Hx     Past Surgical History:  Procedure Laterality Date  . ADENOIDECTOMY    . DENTAL SURGERY  05/28/2016   Bone graft from jaw for dental implants  . TONSILLECTOMY    . WISDOM TOOTH EXTRACTION     Social History   Occupational History  . Not on file  Tobacco Use  . Smoking status: Former Smoker    Quit date: 06/08/1991    Years since quitting: 27.7  . Smokeless tobacco: Never Used  Substance and Sexual Activity  . Alcohol use: Yes    Comment: Drinks on a regular basis socially  . Drug use: Yes    Types: Marijuana  . Sexual activity: Yes

## 2019-06-20 IMAGING — MR MRI OF THE RIGHT KNEE WITHOUT CONTRAST
7 series · 38 of 40 positions shown · non-contrast
Comparison: None.

CLINICAL DATA: Right knee pain since falling 4 years ago

EXAM:
MRI OF THE RIGHT KNEE WITHOUT CONTRAST
TECHNIQUE: Multiplanar, multisequence MR imaging of the knee was performed. No
intravenous contrast was administered.

[Series 4: T2 fat-sat · axial · right · 4.0mm · 0.53mm/px · z∈[-106,+48]mm · 6 of 36 slices shown (1 of 3)]
[im 1/36]
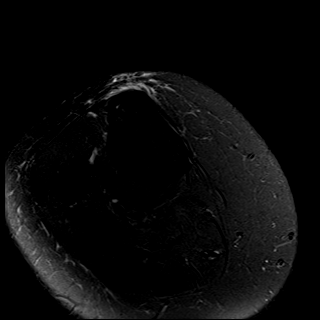
[im 8/36]
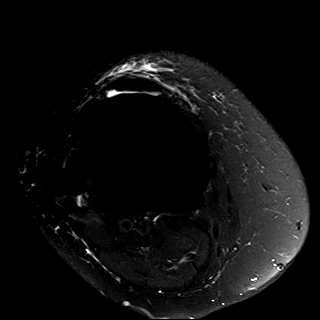
[im 15/36]
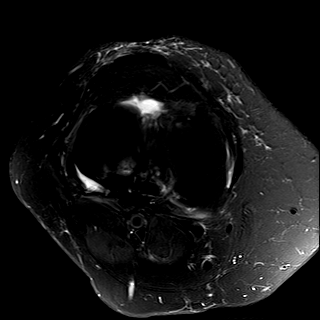
[im 22/36]
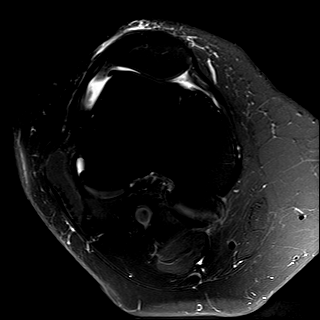
[im 29/36]
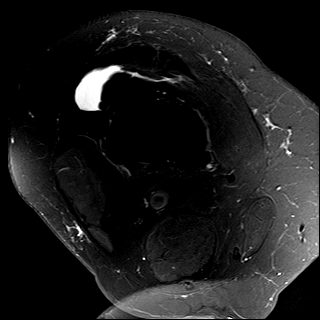
[im 36/36]
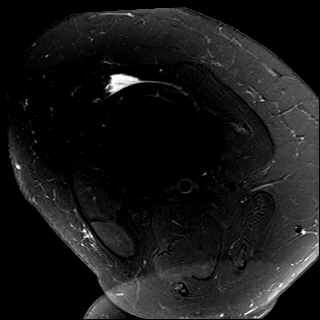

[Series 5: T2 fat-sat · coronal · right · 3.0mm · 0.47mm/px · 7 of 41 slices shown (2 of 3)]
[im 1/41]
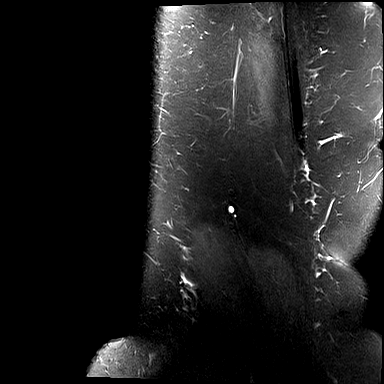
[im 7/41]
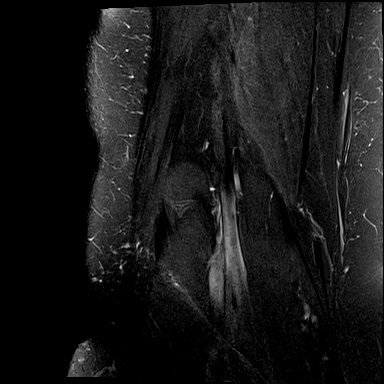
[im 14/41]
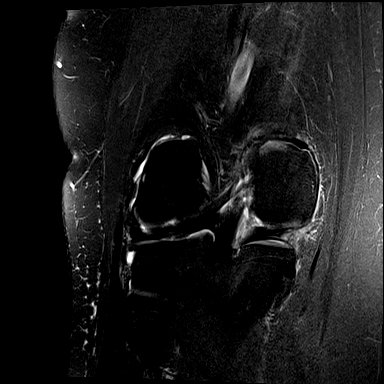
[im 21/41]
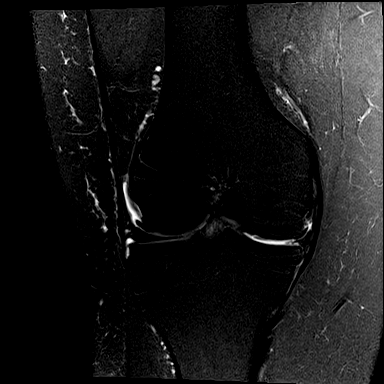
[im 27/41]
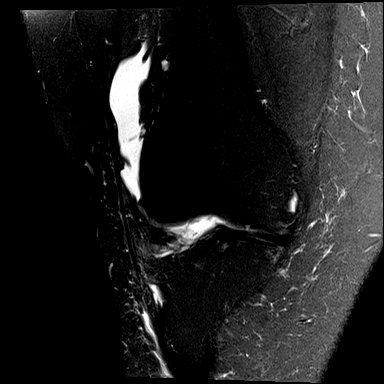
[im 34/41]
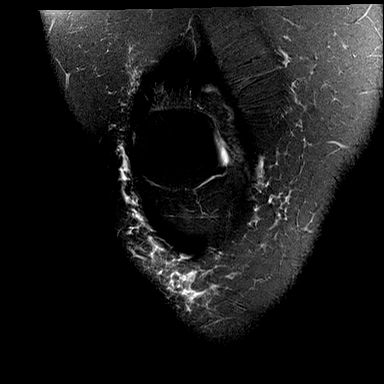
[im 41/41]
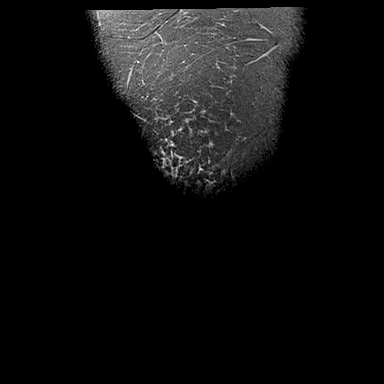

[Series 6: T1 · coronal · right · 3.0mm · 0.47mm/px · 5 of 41 slices shown]
[im 1/41]
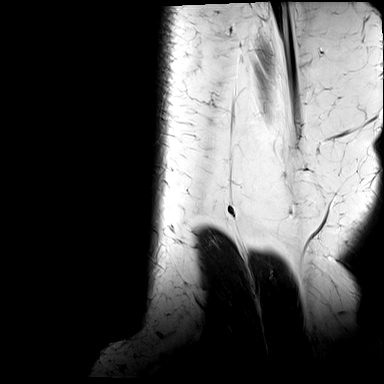
[im 7/41]
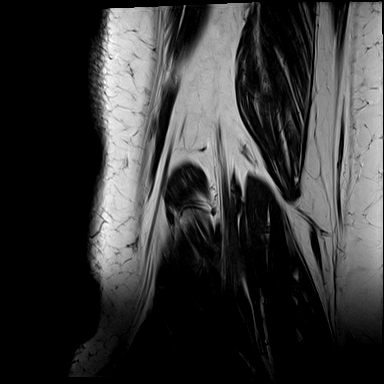
[im 14/41]
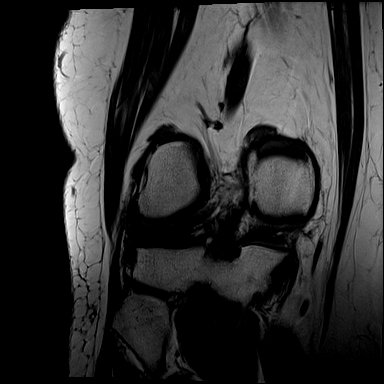
[im 21/41]
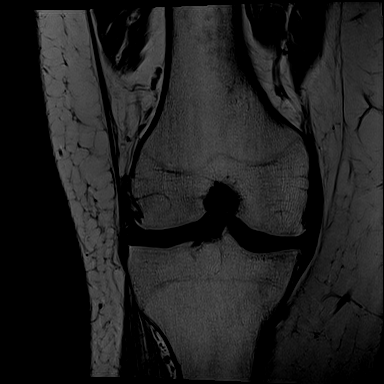
[im 27/41]
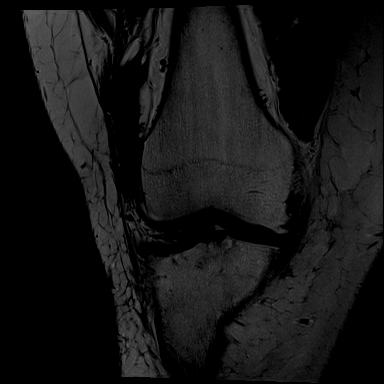

[Series 7: PD fat-sat · coronal · right · 3.0mm · 0.47mm/px · 7 of 41 slices shown (1 of 2)]
[im 1/41]
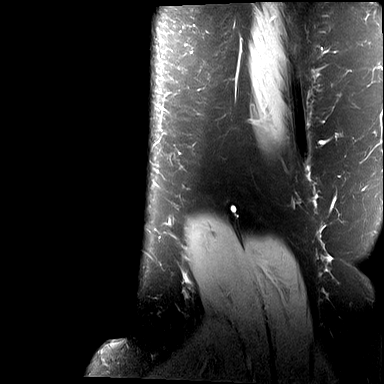
[im 7/41]
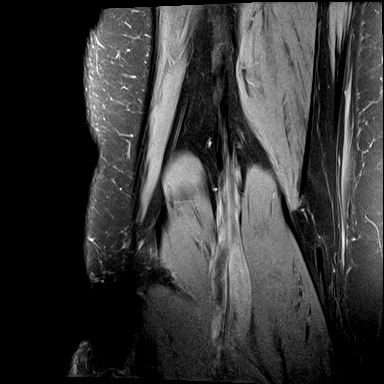
[im 14/41]
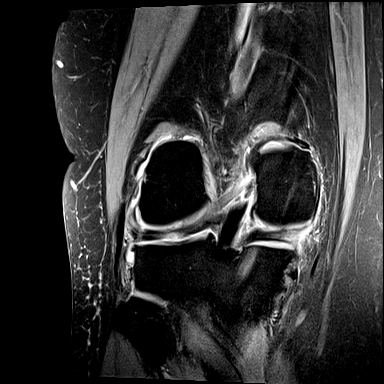
[im 21/41]
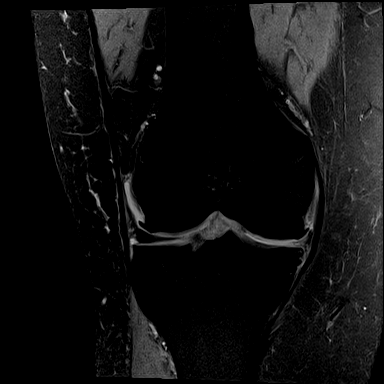
[im 27/41]
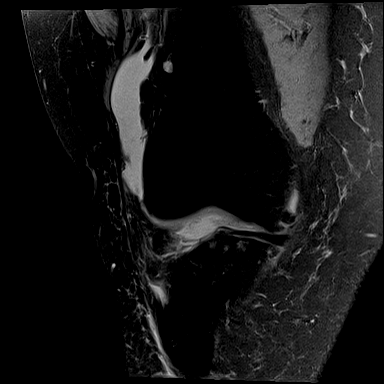
[im 34/41]
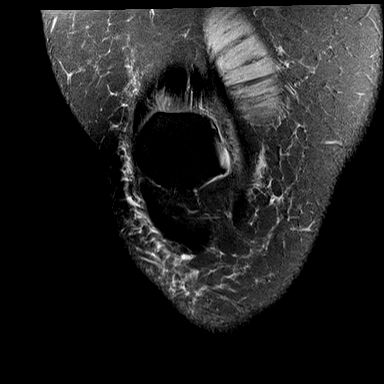
[im 41/41]
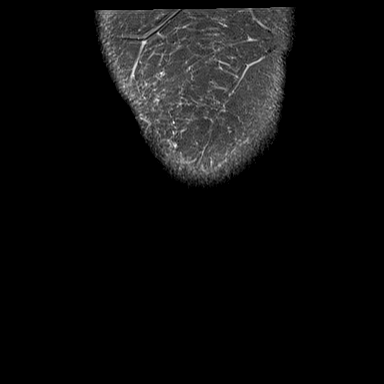

[Series 8: PD fat-sat · sagittal · right · 3.2mm · 0.56mm/px · 5 of 31 slices shown (2 of 2)]
[im 1/31]
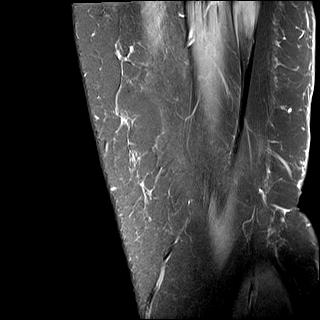
[im 8/31]
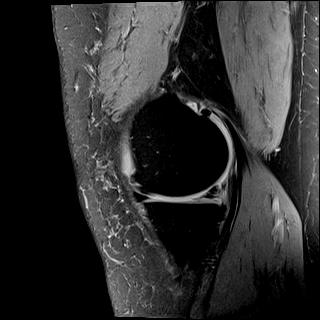
[im 16/31]
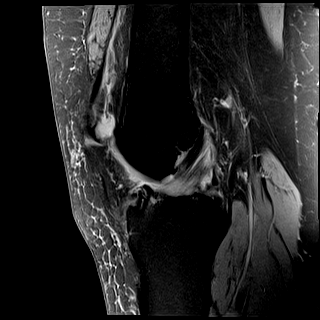
[im 23/31]
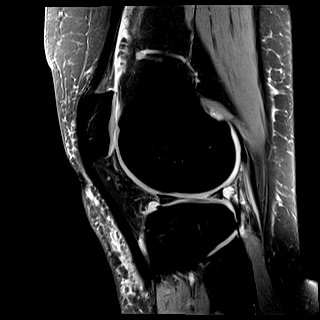
[im 31/31]
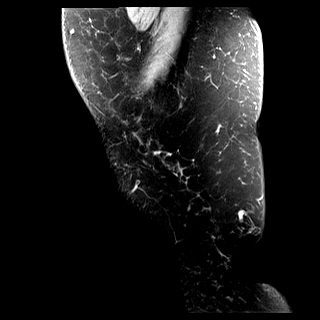

[Series 9: T2 fat-sat · sagittal · right · 3.2mm · 0.56mm/px · 5 of 31 slices shown (3 of 3)]
[im 1/31]
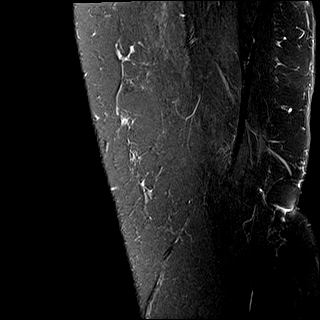
[im 8/31]
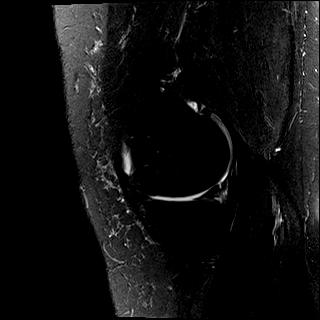
[im 16/31]
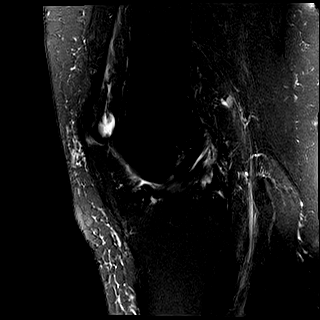
[im 23/31]
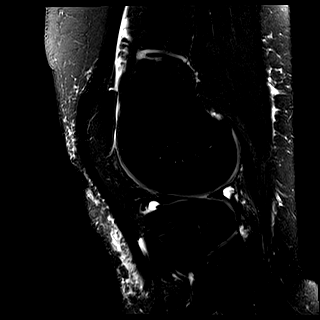
[im 31/31]
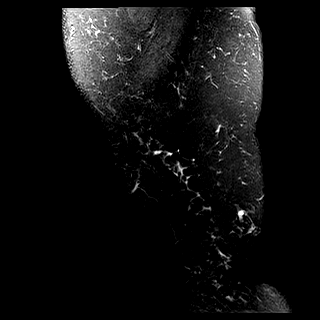

[Series 10: PD · oblique · right · 2.0mm · 0.44mm/px · 3 of 16 slices shown]
[im 1/16]
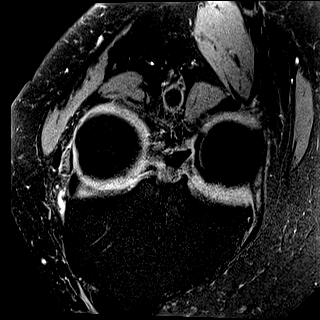
[im 8/16]
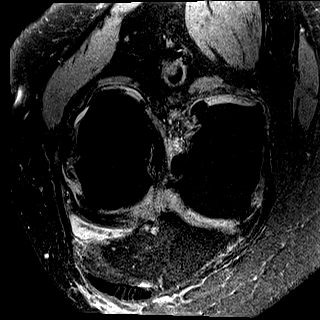
[im 16/16]
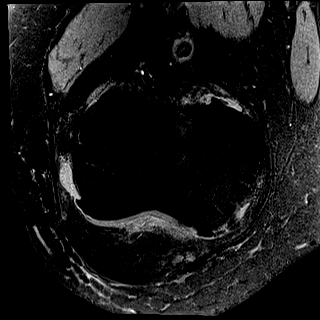

[38 of 40 positions shown; findings below may reference images not displayed]

FINDINGS: MENISCI

Medial meniscus: Large radial tear of the body of the medial
meniscus.

Lateral meniscus: Mild complex tear of the free edge of the body of
the lateral meniscus. Oblique tear of the posterior horn of the
lateral meniscus extending to the inferior articular surface.

LIGAMENTS

Cruciates: Intact ACL. ACL is increased in signal and expanded
consistent with mucinous degeneration. Intact PCL.

Collaterals: Medial collateral ligament is intact. Lateral
collateral ligament complex is intact.

CARTILAGE

Patellofemoral: Partial-thickness cartilage loss of the lateral
patellofemoral compartment.

Medial: High-grade partial-thickness cartilage loss of the medial
femorotibial compartment.

Lateral: Mild partial-thickness cartilage loss of the lateral
femorotibial compartment.

Joint: Small joint effusion. Mild edema in Hoffa's fat. No plical
thickening.

Popliteal Fossa:  No Baker cyst. Intact popliteus tendon.

Extensor Mechanism: Intact quadriceps tendon. Intact patellar
tendon. Intact medial patellar retinaculum. Intact lateral patellar
retinaculum. Intact MPFL.

Bones: No focal marrow signal abnormality. No fracture or
dislocation.

Other: No fluid collection or hematoma.
IMPRESSION: 1. Large radial tear of the body of the medial meniscus.
2. Mild complex tear of the free edge of the body of the lateral
meniscus. Oblique tear of the posterior horn of the lateral meniscus
extending to the inferior articular surface.
3. Intact ACL with mucinous degeneration.
4. Tricompartmental cartilage abnormalities as described above.

## 2019-07-02 ENCOUNTER — Telehealth: Payer: Self-pay | Admitting: Family Medicine

## 2019-07-02 NOTE — Telephone Encounter (Signed)
Patient is interested in getting the Shingles vaccine as he has not received one yet.

## 2019-07-03 NOTE — Telephone Encounter (Signed)
Please advise. Ok for vaccine?

## 2019-07-04 NOTE — Telephone Encounter (Signed)
Spoke with patient. He is aware to get this from the pharmacy.

## 2019-07-04 NOTE — Telephone Encounter (Signed)
Yes he can get it at his pharmacy

## 2019-07-24 ENCOUNTER — Encounter: Payer: Self-pay | Admitting: Orthopaedic Surgery

## 2019-07-24 ENCOUNTER — Telehealth: Payer: Self-pay

## 2019-07-24 ENCOUNTER — Other Ambulatory Visit: Payer: Self-pay

## 2019-07-24 ENCOUNTER — Ambulatory Visit: Payer: Medicare PPO | Admitting: Orthopaedic Surgery

## 2019-07-24 DIAGNOSIS — S83279D Complex tear of lateral meniscus, current injury, unspecified knee, subsequent encounter: Secondary | ICD-10-CM | POA: Diagnosis not present

## 2019-07-24 DIAGNOSIS — M1711 Unilateral primary osteoarthritis, right knee: Secondary | ICD-10-CM

## 2019-07-24 DIAGNOSIS — S83231D Complex tear of medial meniscus, current injury, right knee, subsequent encounter: Secondary | ICD-10-CM | POA: Diagnosis not present

## 2019-07-24 MED ORDER — LIDOCAINE HCL 1 % IJ SOLN
2.0000 mL | INTRAMUSCULAR | Status: AC | PRN
  Administered 2019-07-24: 2 mL

## 2019-07-24 MED ORDER — BUPIVACAINE HCL 0.5 % IJ SOLN
2.0000 mL | INTRAMUSCULAR | Status: AC | PRN
  Administered 2019-07-24: 2 mL via INTRA_ARTICULAR

## 2019-07-24 MED ORDER — METHYLPREDNISOLONE ACETATE 40 MG/ML IJ SUSP
40.0000 mg | INTRAMUSCULAR | Status: AC | PRN
  Administered 2019-07-24: 40 mg via INTRA_ARTICULAR

## 2019-07-24 NOTE — Telephone Encounter (Signed)
That is fine. Whatever the insurance covers. Thank you.

## 2019-07-24 NOTE — Telephone Encounter (Signed)
Please submit for Gel injection  Right knee- Dr. Erlinda Hong.

## 2019-07-24 NOTE — Progress Notes (Addendum)
Office Visit Note   Patient: Jesse Chase           Date of Birth: 01-21-1951           MRN: [id number] Visit Date: 07/24/2019              Requested by: Laurey Morale, MD [place],  [place] [id number] PCP: Laurey Morale, MD   Assessment & Plan: Visit Diagnoses:  1. Primary osteoarthritis of right knee   2. Complex tear of medial meniscus of right knee as current injury, subsequent encounter   3. Complex tear of lateral meniscus of knee as current injury, subsequent encounter     Plan: My impression is degenerative joint disease right knee.  We repeated the cortisone injection today.  We will also submit for approval Synvisc injection.  I will see the patient in follow-up for Synvisc injection.  Follow-Up Instructions: No follow-ups on file.   Orders:  Orders Placed This Encounter  Procedures  . Large Joint Inj   No orders of the defined types were placed in this encounter.     Procedures: Large Joint Inj: R knee on 07/24/2019 1:16 PM Indications: pain Details: 22 G needle  Arthrogram: No  Medications: 40 mg methylPREDNISolone acetate 40 MG/ML; 2 mL lidocaine 1 %; 2 mL bupivacaine 0.5 % Consent was given by the patient. Patient was prepped and draped in the usual sterile fashion.       Clinical Data: No additional findings.   Subjective: Chief Complaint  Patient presents with  . Right Knee - Pain    Jesse Chase returns today for follow-up of his right knee pain.  Previous cortisone injection did not give much relief.  He is requesting another one.  He is also interested in viscosupplementation.  He still is able to play disc golf and he remains quite active.   Review of Systems   Objective: Vital Signs: There were no vitals taken for this visit.  Physical Exam  Ortho Exam Right knee exam is unchanged.  No joint effusion.  Mild pain with range of motion that is mildly restricted. Specialty Comments:  No specialty comments  available.  Imaging: No results found.   PMFS History: Patient Active Problem List   Diagnosis Date Noted  . Primary osteoarthritis of right knee 07/24/2019  . Complex tear of medial meniscus of right knee as current injury 11/08/2018  . Complex tear of lateral meniscus of knee as current injury, subsequent encounter 11/08/2018  . Post-traumatic osteoarthritis of right knee 02/28/2018   Past Medical History:  Diagnosis Date  . Arthritis   . Asthma    Childhood "bronchial asthma"    Family History  Problem Relation Age of Onset  . Colon cancer Neg Hx   . Esophageal cancer Neg Hx   . Liver cancer Neg Hx   . Pancreatic cancer Neg Hx   . Rectal cancer Neg Hx   . Stomach cancer Neg Hx     Past Surgical History:  Procedure Laterality Date  . ADENOIDECTOMY    . DENTAL SURGERY  05/28/2016   Bone graft from jaw for dental implants  . TONSILLECTOMY    . WISDOM TOOTH EXTRACTION     Social History   Occupational History  . Not on file  Tobacco Use  . Smoking status: Former Smoker    Quit date: 06/08/1991    Years since quitting: 28.1  . Smokeless tobacco: Never Used  Substance and Sexual Activity  .  Alcohol use: Yes    Comment: Drinks on a regular basis socially  . Drug use: Yes    Types: Marijuana  . Sexual activity: Yes

## 2019-07-24 NOTE — Telephone Encounter (Signed)
Submitted for Monovisc-Right knee   FYI Per Dr. Phoebe Sharps note he stated synvisc but pt's insurance requires monovisc

## 2019-07-25 ENCOUNTER — Telehealth: Payer: Self-pay

## 2019-07-25 NOTE — Telephone Encounter (Signed)
Lvm for pt to call back to schedule  Pt is a new start so no restrictions on scheduling

## 2019-07-25 NOTE — Telephone Encounter (Signed)
Approved for Monovisc-Right knee Dr. Frederik Pear and Bill $50 copay 20% OOP No prior auth required

## 2019-11-20 ENCOUNTER — Ambulatory Visit: Payer: Medicare PPO | Admitting: Orthopaedic Surgery

## 2019-11-20 ENCOUNTER — Encounter: Payer: Self-pay | Admitting: Orthopaedic Surgery

## 2019-11-20 DIAGNOSIS — M1711 Unilateral primary osteoarthritis, right knee: Secondary | ICD-10-CM

## 2019-11-20 MED ORDER — HYALURONAN 88 MG/4ML IX SOSY
88.0000 mg | PREFILLED_SYRINGE | INTRA_ARTICULAR | Status: AC | PRN
  Administered 2019-11-20: 88 mg via INTRA_ARTICULAR

## 2019-11-20 NOTE — Progress Notes (Signed)
° °  Procedure Note  Patient: Jesse Chase             Date of Birth: [birth year]           MRN: [id number]             Visit Date: 11/20/2019  Procedures: Visit Diagnoses:  1. Primary osteoarthritis of right knee     Large Joint Inj: R knee on 11/20/2019 1:15 PM Indications: pain Details: 22 G needle  Arthrogram: No  Medications: 88 mg Hyaluronan 88 MG/4ML Outcome: tolerated well, no immediate complications Patient was prepped and draped in the usual sterile fashion.

## 2020-03-13 ENCOUNTER — Telehealth: Payer: Self-pay | Admitting: Orthopaedic Surgery

## 2020-03-13 NOTE — Telephone Encounter (Signed)
Pt called to reschedule his 03/18/20 appt to 05/13/20 and he wanted to make sure we would still have everything for the injection at the new appt date since it usually has to be ordered

## 2020-03-13 NOTE — Telephone Encounter (Signed)
Approved 07/25/2019. Will this still be valid in December or do we have to resubmit?

## 2020-03-13 NOTE — Telephone Encounter (Signed)
Patient received last Monovisc inj. 11/20/2019, next Monovisc inj.would be after 05/21/2020. Will submit for new gel injection in November, 2021.  Patient would need to R/S appt.after 05/21/2020.

## 2020-03-14 NOTE — Telephone Encounter (Signed)
Called patient no answer LMOM. Need to R/S appt  from 05/13/2020 to anytime after 05/21/2020. They will contact him once inj is approved.

## 2020-03-18 ENCOUNTER — Ambulatory Visit: Payer: Medicare PPO | Admitting: Orthopaedic Surgery

## 2020-04-25 ENCOUNTER — Telehealth: Payer: Self-pay

## 2020-04-25 NOTE — Telephone Encounter (Signed)
Submitted VOB, Monovisc, right knee. 

## 2020-05-07 ENCOUNTER — Telehealth: Payer: Self-pay

## 2020-05-07 NOTE — Telephone Encounter (Signed)
How do I do that?

## 2020-05-07 NOTE — Telephone Encounter (Signed)
Do I need to add that documentation to his previous clinic note?

## 2020-05-07 NOTE — Telephone Encounter (Signed)
Updated documentation is needed for PA to be approved for Monovisc, right knee.   Documentation of an inadequate response to conservative nondrug treatments such as education, strengthening and ROM exercises, assisted devices and weight loss.  Please advise.  Thank you.

## 2020-05-07 NOTE — Telephone Encounter (Signed)
No Sir, it has to be a new note.

## 2020-05-07 NOTE — Telephone Encounter (Signed)
PA required for Monovisc, right knee. PA currently Pending. Reference# [id number]

## 2020-05-08 ENCOUNTER — Telehealth: Payer: Self-pay

## 2020-05-08 NOTE — Telephone Encounter (Signed)
Talked with patient concerning gel injection.  Patient was very rude and upset stating that he needed to speak with someone with some common sense.  Advised patient that his insurance requires a PA through [employer] and that they are requesting some updated documentation since his last gel injection.  Patient's last injection was on 11/20/2019 and his next available gel injection would be after 05/21/2020.  Advised patient that he would need to be seen so this documentation could be provided to [facility] from Dr. Erlinda Hong.  Provided patient with number to [facility] [phone]).  Patient stated that he was looking for another orthopedic provider anyway and that he may give [place] a call back.

## 2020-05-08 NOTE — Telephone Encounter (Signed)
Noted! Thank you

## 2020-05-08 NOTE — Telephone Encounter (Signed)
See previous note in chart.

## 2020-05-08 NOTE — Telephone Encounter (Signed)
Yes just have him come in please and let him know that his insurance is requiring specific documentation.  Thanks.

## 2020-05-08 NOTE — Telephone Encounter (Signed)
Called and left a VM advising patient to call back concerning gel injection.

## 2020-05-08 NOTE — Telephone Encounter (Signed)
Should patient just come into the office for a F/U from his last injection? His next available injection will need to be after 05/21/2020.  Please let me know.  Thank you.

## 2020-05-13 ENCOUNTER — Ambulatory Visit: Payer: Medicare PPO | Admitting: Orthopaedic Surgery

## 2020-05-16 ENCOUNTER — Telehealth: Payer: Self-pay | Admitting: Orthopaedic Surgery

## 2020-05-16 NOTE — Telephone Encounter (Signed)
Pt called with Mardene Celeste from [facility] and they are wanting [place] to check with medicare guidelines to see if we can evaluate the pts knee and if determined to need a gel shot then we can do that all on the same day. I did try to make pt aware of previous messages and he was very rude and continued stating that it doesn't make sense for him to have to do two office visits to determine what Dr. Erlinda Hong already knows about his knee. Pt would like a CB to be updated on medicares procedures/guidelines for the gel injections  [phone]

## 2020-05-20 ENCOUNTER — Telehealth: Payer: Self-pay

## 2020-05-20 NOTE — Telephone Encounter (Signed)
I called patient and LM explaining the need to see him in office for eval and why, and to call me if I can help explain further, or help him in any way.  Noted also Dr Phoebe Sharps note from today.

## 2020-05-20 NOTE — Telephone Encounter (Signed)
Returned call to patient from message that was received on Friday, 05/16/2020.  Tried to explain to the patient for the 2nd time that his insurance is requiring an authorization through a third party, by the name of [facility].  Advised patient that he will not be able to receive an gel injection on the same day, due to his insurance requiring an authorization and it has to be approved before appointment.  Patient started getting rude, upset, and saying not to interrupt him and to let him speak and that I should have been here on Friday when he called with [facility] on the line. Patient stated that he was told that an authorization is not required, and I tried to explain to him that it is and then he started getting more upset and yelling stating that I needed to call his insurance and that I wasn't doing my job and the more I tried to explain it to him and ask him not to speak to me that way, he got even worse and said, " fuck it" hung up the phone.

## 2020-05-20 NOTE — Telephone Encounter (Signed)
Please review below and all notes referring to current issues with gel injection. Please advise how you wish to proceed.

## 2020-05-20 NOTE — Telephone Encounter (Signed)
He is more than welcome to find a new provider if he is unpleased with our process of obtaining approval for gel injections. Our hands are tied until we meet the requirements that are set by insurance and/or third-party.

## 2020-05-20 NOTE — Telephone Encounter (Signed)
Patient called regarding approval for injection , I tried explaining to the patient he would have to come into the office to be seen before receiving inj. He went on stating [employer] told him he doesn't have to follow up unless its been a year. Patient went on stating [employer] told him we don't need to use [facility] I explained to him that's the third party we use he cut me off telling me to listen to him. He stated him and April have been communicating for a while now and she's "no help" he stated he is in the process of finding a new provider.

## 2020-05-20 NOTE — Telephone Encounter (Signed)
See previous message in chart concerning gel injection.

## 2020-05-21 ENCOUNTER — Telehealth: Payer: Self-pay | Admitting: Orthopaedic Surgery

## 2020-05-21 NOTE — Telephone Encounter (Signed)
Sounds good.  Our staff does not deserve to be treated this way.

## 2020-05-21 NOTE — Telephone Encounter (Signed)
Pt called Very Rude and disrespectful demanding me give him an e-mail address to our office.  I explained to the pt that I can route him or take a message to get him in touch with who he needed to speak with. I had to pull up the pt by his number due to the fact that he told me that his name and date of birth was irrelevant.  Pt told me that I needed to get my shit together and learn how to do my fucking job because I obviously don't know what is going on since I can't put him in direct contact with Dr. Erlinda Hong or help him.  He told me that the janitor would be more help than I could ever be.

## 2020-05-21 NOTE — Telephone Encounter (Signed)
See below. Patient continues to be disrespectful to staff. Please advise.

## 2020-05-21 NOTE — Telephone Encounter (Signed)
Noted  

## 2020-05-21 NOTE — Telephone Encounter (Signed)
I called pt and before I could say anything he advised that he wants his medical records sent to another office. I advised that we could do this he would just need to fill out a medical records release and he states that he does not want to come into our office at all. He requested that I fax the records release to his niece at [phone] that they own the company that this is being faxed to. He would receive the fax complete and fax back to [place] requesting that any and all chart information with a disc of his xrays be sent to the provider of his choosing. He pt then wanted to go into great detail about his displeasure with the office and how we did not know what we were doing, needed to change our protocols, have no problem mailing out bills but have a huge delay in releasing medical records. I advised the pt that I am just trying to meet him where he is and that if he is unwilling to come into the office that this is the best that I can do being that he also did not want to wait for the mail. After nearly 13 minutes on the phone the pt said that I was going to listen to him and hear his side of events. I advised that this was not necessary and we have already found resolution and had a plan on how we would get this done. He said that I will let him finish and I will listen to him and I advised tht I was calling to see how I could help and I have done that and that I had my own patients in the office that needed care and he stated that it goes to show how much I cared about the pt that I had on the phone and was getting louder and louder and I disconnected the call.   Of note... after speaking with Malachy Mood she advised that we could do this at no charge to the pt as this was not going to be processed through [facility] and that the pt refuses to come into the building for any reason and it is the least confrontational way to proceed. I faxed the form to the number provided and received a confirmation that the fax went  through. Will send this message to medical records and supervisor

## 2020-05-21 NOTE — Telephone Encounter (Signed)
Patient returned my call from earlier.  He states at this point he wants no further explanation on anything.    He wants his records so he can go elsewhere.    He requests at this point if any communication is needed from [place] to him it be via email.  [email or web address].  Please send all emails to him encrypted per [facility] policy.   Per Autumn F. [employer] form faxed to his niece Benjamine Mola at [phone], with fax confirmation received.

## 2020-05-26 NOTE — Telephone Encounter (Signed)
Auth received.patient emailed to [facility]. Records faxed to [facility] [phone]

## 2020-06-25 DIAGNOSIS — M25561 Pain in right knee: Secondary | ICD-10-CM | POA: Diagnosis not present

## 2020-07-01 ENCOUNTER — Ambulatory Visit: Payer: Medicare PPO | Admitting: Orthopaedic Surgery

## 2020-07-10 ENCOUNTER — Telehealth: Payer: Self-pay | Admitting: Family Medicine

## 2020-07-10 NOTE — Telephone Encounter (Signed)
Left message for patient to call back and schedule Medicare Annual Wellness Visit (AWV) either virtually or in office.   Last AWV no information  please schedule at anytime with [facility] Nurse Health Advisor 1 or 2   This should be a 45 minute visit. Patient also needs appointment with pcp last appointment 02/01/2019

## 2020-09-01 ENCOUNTER — Telehealth: Payer: Self-pay | Admitting: Family Medicine

## 2020-09-01 NOTE — Telephone Encounter (Signed)
Left message for patient to call back and schedule Medicare Annual Wellness Visit (AWV) either virtually or in office. No detailed message left   awvi 10/05/17 per [facility]  please schedule at anytime with [facility] Nurse Health Advisor 1 or 2   This should be a 45 minute visit. Patient also needs appointment with pcp last appointment 02/01/2019

## 2020-09-02 NOTE — Telephone Encounter (Signed)
The patient doesn't need this appointment and he only comes to the doctor when his body tells him too.

## 2020-10-08 DIAGNOSIS — M1711 Unilateral primary osteoarthritis, right knee: Secondary | ICD-10-CM | POA: Diagnosis not present

## 2020-11-26 ENCOUNTER — Telehealth: Payer: Self-pay | Admitting: Family Medicine

## 2020-11-26 NOTE — Telephone Encounter (Signed)
Left message for patient to call back and schedule Medicare Annual Wellness Visit (AWV) either virtually or in office.   awvi 10/05/17  per [facility]  please schedule at anytime with [facility] Nurse Health Advisor 1 or 2  Patient also needs appointment with pcp last appointment 02/01/2019   This should be a 45 minute visit.

## 2020-11-26 NOTE — Telephone Encounter (Signed)
Patient called back and do not want to make appointment at this time.

## 2021-01-08 ENCOUNTER — Telehealth: Payer: Self-pay | Admitting: Family Medicine

## 2021-01-08 NOTE — Telephone Encounter (Signed)
Please advise 

## 2021-01-08 NOTE — Telephone Encounter (Signed)
Spoke with patient to schedule AWV and follow up with dr fry  last appt 02/01/2019  Patient declined AWV does not want to do.  Pt wants a call before 3 year rule to schedule an appointment if necessary  Pt does not want to be dropped as Dr Sarajane Jews Patient    Patient also mention that when he plays disk golf (walks 2 miles in woods) he gets more winded.  He stated he can not do the 18 holes without stopping to rest.  He stops around 9/10 to rest.    He also stated when cutting grass he gets more winded.  He didn't want to make appointment unless Dr Sarajane Jews thought he needed to.

## 2021-01-09 NOTE — Telephone Encounter (Signed)
Lvm also for patient to call office to schedule an appointment.

## 2021-01-09 NOTE — Telephone Encounter (Signed)
Left detailed message on pt phone to call the office and schedule an OV with Dr Sarajane Jews

## 2021-01-09 NOTE — Telephone Encounter (Signed)
Please schedule an in person OV soon so we can discuss his SOB

## 2021-04-15 DIAGNOSIS — M1711 Unilateral primary osteoarthritis, right knee: Secondary | ICD-10-CM | POA: Diagnosis not present

## 2021-10-29 ENCOUNTER — Telehealth: Payer: Self-pay

## 2021-10-29 NOTE — Telephone Encounter (Signed)
Pt advised the last OV 02/01/19. Pt declines to schedule appt b/c he states he is "healthy"; Pt advised the provider typically likes to see patient once a year to catch up & that even if they have no health issues getting bloodwork drawn is a good idea since there are some things that may arise without symptoms. Pt continues to decline offer to schedule but points out that he notices that he's winded when it's hot outside & that he is pretty sure he is having heart issues, but that he is not going to make the appt until he thinks it gets bad enough. Pt advised that after 3 years, per [employer], he will have to establish care as new patient with Dr Sarajane Jews or other in office provider. Pt became very upset at this point & let me know he is happy with his provider, but not happy with [facility], he will call when he is ready to schedule his appt.

## 2021-11-04 ENCOUNTER — Telehealth: Payer: Self-pay | Admitting: Family Medicine

## 2021-11-04 NOTE — Telephone Encounter (Signed)
FYI

## 2021-11-04 NOTE — Telephone Encounter (Signed)
Pt having cardiovascular concerns and would like a referral to Dr Kerri Perches at [facility]. Having SOB, easily tired. Pt requesting guidance

## 2021-11-04 NOTE — Telephone Encounter (Signed)
Left pt detailed message advised to call the office in the morning regarding concerns

## 2021-11-05 NOTE — Telephone Encounter (Signed)
Lvm for patient to call office and schedule OV.

## 2021-11-05 NOTE — Telephone Encounter (Signed)
He needs an OV. I have not seen him in over 4 years

## 2021-11-26 DIAGNOSIS — Z130 Encounter for screening for diseases of the blood and blood-forming organs and certain disorders involving the immune mechanism: Secondary | ICD-10-CM | POA: Diagnosis not present

## 2021-11-26 DIAGNOSIS — M1731 Unilateral post-traumatic osteoarthritis, right knee: Secondary | ICD-10-CM | POA: Diagnosis not present

## 2021-11-26 DIAGNOSIS — Z9229 Personal history of other drug therapy: Secondary | ICD-10-CM | POA: Diagnosis not present

## 2021-11-26 DIAGNOSIS — J410 Simple chronic bronchitis: Secondary | ICD-10-CM | POA: Diagnosis not present

## 2021-11-26 DIAGNOSIS — I38 Endocarditis, valve unspecified: Secondary | ICD-10-CM | POA: Diagnosis not present

## 2021-11-26 DIAGNOSIS — R030 Elevated blood-pressure reading, without diagnosis of hypertension: Secondary | ICD-10-CM | POA: Diagnosis not present

## 2021-11-26 DIAGNOSIS — Z9189 Other specified personal risk factors, not elsewhere classified: Secondary | ICD-10-CM | POA: Diagnosis not present

## 2021-11-26 DIAGNOSIS — R0609 Other forms of dyspnea: Secondary | ICD-10-CM | POA: Diagnosis not present

## 2021-11-26 DIAGNOSIS — Z125 Encounter for screening for malignant neoplasm of prostate: Secondary | ICD-10-CM | POA: Diagnosis not present

## 2021-11-26 DIAGNOSIS — Z0000 Encounter for general adult medical examination without abnormal findings: Secondary | ICD-10-CM | POA: Diagnosis not present

## 2021-12-01 DIAGNOSIS — R9431 Abnormal electrocardiogram [ECG] [EKG]: Secondary | ICD-10-CM | POA: Diagnosis not present

## 2021-12-01 DIAGNOSIS — R011 Cardiac murmur, unspecified: Secondary | ICD-10-CM | POA: Diagnosis not present

## 2021-12-01 DIAGNOSIS — E785 Hyperlipidemia, unspecified: Secondary | ICD-10-CM | POA: Diagnosis not present

## 2021-12-01 DIAGNOSIS — R0600 Dyspnea, unspecified: Secondary | ICD-10-CM | POA: Diagnosis not present

## 2021-12-01 DIAGNOSIS — I4510 Unspecified right bundle-branch block: Secondary | ICD-10-CM | POA: Diagnosis not present

## 2021-12-09 DIAGNOSIS — M1711 Unilateral primary osteoarthritis, right knee: Secondary | ICD-10-CM | POA: Diagnosis not present

## 2021-12-11 DIAGNOSIS — R9431 Abnormal electrocardiogram [ECG] [EKG]: Secondary | ICD-10-CM | POA: Diagnosis not present

## 2021-12-11 DIAGNOSIS — Z136 Encounter for screening for cardiovascular disorders: Secondary | ICD-10-CM | POA: Diagnosis not present

## 2021-12-11 DIAGNOSIS — R0600 Dyspnea, unspecified: Secondary | ICD-10-CM | POA: Diagnosis not present

## 2021-12-30 DIAGNOSIS — R011 Cardiac murmur, unspecified: Secondary | ICD-10-CM | POA: Diagnosis not present

## 2022-01-05 DIAGNOSIS — J449 Chronic obstructive pulmonary disease, unspecified: Secondary | ICD-10-CM | POA: Diagnosis not present

## 2022-01-05 DIAGNOSIS — R0602 Shortness of breath: Secondary | ICD-10-CM | POA: Diagnosis not present

## 2022-01-05 DIAGNOSIS — Z6835 Body mass index (BMI) 35.0-35.9, adult: Secondary | ICD-10-CM | POA: Diagnosis not present

## 2022-01-05 DIAGNOSIS — E669 Obesity, unspecified: Secondary | ICD-10-CM | POA: Diagnosis not present

## 2022-02-10 ENCOUNTER — Encounter: Payer: Self-pay | Admitting: Internal Medicine

## 2022-02-10 ENCOUNTER — Ambulatory Visit: Payer: Medicare PPO | Attending: Internal Medicine | Admitting: Internal Medicine

## 2022-02-10 VITALS — BP 144/90 | HR 73 | Ht 76.0 in | Wt 293.0 lb

## 2022-02-10 DIAGNOSIS — I10 Essential (primary) hypertension: Secondary | ICD-10-CM

## 2022-02-10 NOTE — Progress Notes (Signed)
Cardiology Office Note   Date:  02/10/2022   ID:  Jesse Chase, DOB 17-Dec-1950, MRN [id number]  PCP:  Laurey Morale, MD  Cardiologist:   Dorris Carnes, MD    Pt referred for eval of dyspnea and valvular problems    History of Present Illness: Jesse Chase is a 71 y.o. male with a history of  reported valve disease, Hx fen/phen use in the past, RBBB, COPD   Referred for evaluation of dyspnea with exertion The pt is followed by Glennis Brink at [facility]    He was referred to Dr Flossie Dibble cardiology, [facility]   Had an echo done which showed normal LVEF, mild diastolic dysfunction, normal valve function.  He also underwent a lexiscan myoview which was showed no ischemia  The pt says he felt nothing during the infusion The pt has also been seen in pulmonary   Hx asthma   Told he has mod COPD   on inhalers now   Says he is some better   Not like he was a couple years ago    Denies wheezing   No CP  Stil gets SOB with some activities  The pt is an avid Research scientist (medical)   He used to play 18 holes of the game, up and down hills  About 1.5 years ago he had to go to 9 holes then only flat holes .  After inhalers started he has gone back to 18 but only flat  Still gets winded some   Notes no changes in environment that could exacerbate breathing    Current Meds  Medication Sig   albuterol (VENTOLIN HFA) 108 (90 Base) MCG/ACT inhaler Inhale into the lungs.   BEVESPI AEROSPHERE 9-4.8 MCG/ACT AERO Inhale into the lungs.   diclofenac sodium (VOLTAREN) 1 % GEL Apply 2 g topically 4 (four) times daily.   Current Facility-Administered Medications for the 02/10/22 encounter (Office Visit) with Fay Records, MD  Medication   0.9 %  sodium chloride infusion     Allergies:   Penicillins   Past Medical History:  Diagnosis Date   Arthritis    Asthma    Childhood "bronchial asthma"    Past Surgical History:  Procedure Laterality Date   ADENOIDECTOMY     DENTAL SURGERY  05/28/2016   Bone graft from jaw  for dental implants   TONSILLECTOMY     WISDOM TOOTH EXTRACTION       Social History:  The patient  reports that he quit smoking about 30 years ago. His smoking use included cigarettes. He has never used smokeless tobacco. He reports current alcohol use. He reports current drug use. Drug: Marijuana.   Family History:  The patient's family history is not on file.    ROS:  Please see the history of present illness. All other systems are reviewed and  Negative to the above problem except as noted.    PHYSICAL EXAM: VS:  BP (!) 144/90   Pulse 73   Ht '6\' 4"'$  (1.93 m)   Wt 293 lb (132.9 kg)   SpO2 94%   BMI 35.67 kg/m    HYPERTENSION CONTROL Vitals:   02/10/22 1337 02/10/22 1911  BP: (!) 144/88 (!) 144/90    The patient's blood pressure is elevated above target today.  In order to address the patient's elevated BP: Follow up with primary care provider for management.      GEN: Obese 71 yo  in no acute distress  HEENT: normal  Neck: no JVD, carotid bruits Cardiac: RRR; no murmur  No LE  edema  Respiratory:  clear to auscultation bilaterally,  No wheezes   GI: soft, nontender, nondistended, + BS  No hepatomegaly  MS: no deformity Moving all extremities   Skin: warm and dry, no rash Neuro:  Strength and sensation are intact Psych: euthymic mood, full affect   EKG:  EKG is ordered today.   NSR   73 bpm  RBBB   [facility] Testing   Myoview 12/11/21   1. No evidence of infarct or ischemia.  2. Slightly decreased left ventricular ejection fraction of 44%.  3. No focal wall motion abnormalities.   Echo   12/30/21  Left Ventricle  Left ventricle size is normal. Wall thickness is normal. Systolic function is normal. EF: 55-60%. Wall motion is normal. Doppler parameters consistent with mild diastolic dysfunction and low to normal LA pressure.   Right Ventricle  Right ventricle size is normal. Systolic function is normal.   Left Atrium  Left atrium size is normal.   Right  Atrium  Right atrium size is normal.   IVC/SVC  The inferior vena cava demonstrates a diameter of <=2.1 cm and collapses >50%; therefore, the right atrial pressure is estimated at 3 mmHg.   Mitral Valve  Mitral valve structure is normal. There is trace regurgitation.   Tricuspid Valve  Tricuspid valve structure is normal. There is trace regurgitation. The right ventricular systolic pressure is normal (<36 mmHg).   Aortic Valve  The aortic valve is tricuspid. The leaflets are not thickened and exhibit normal excursion. There is no regurgitation or stenosis.   Pulmonic Valve  The pulmonic valve was not well visualized. Trace regurgitation.   Ascending Aorta  The aortic root is normal in size. The ascending aorta is normal in size.   Pericardium  There is no pericardial effusion.   Lipid Panel    Component Value Date/Time   CHOL 182 06/29/2017 1128   TRIG 109.0 06/29/2017 1128   HDL 60.90 06/29/2017 1128   CHOLHDL 3 06/29/2017 1128   VLDL 21.8 06/29/2017 1128   LDLCALC 99 06/29/2017 1128      Wt Readings from Last 3 Encounters:  02/10/22 293 lb (132.9 kg)  02/01/19 289 lb (131.1 kg)  12/20/17 (!) 302 lb (137 kg)      ASSESSMENT AND PLAN:  1 Dyspnea with exertion.   The pt noted the development of DOE about 1.5 years ago    Cut back on activity     Seen in cardiology  Echo OK    Myoview normal   No ischemia  The patient has also been seen in pulmonary  Told has mod COPD     He is on inhalers   His breathing is some better but he is not back to baseline   Still not doing hills   I have reviewed his tests   Myoview reported normal   But, as a noninvasive test not perfect   Can miss ischemia or balanced ischemia   this would be my only concern, a false negative   From a pulmonary standpoint I am not sure what changed in such a relatively short period to lead to his symptoms    He is still not back to normal Would consider a CT coronary angiogram to define if has CAD and if  anything flow limiting    Will review with K Shepperson  2   HTN   BP today is up  a little , even on my check   he says it is labile   130s to 170s    I would recomm adding something to his regimen    Possibly add amlodipine 5 mg     Will again review with K Shepperson      I have not sched a definite follow up  Tests / changes can be done at [facility] if he wants to continue care there      Current medicines are reviewed at length with the patient today.  The patient does not have concerns regarding medicines.  Signed, Dorris Carnes, MD  02/10/2022 7:12 PM    [facility] [place], [place], [place]  [address] Phone: [phone]; Fax: [phone]

## 2022-02-10 NOTE — Progress Notes (Signed)
Recommendations made  Will forward for review.

## 2022-02-10 NOTE — Patient Instructions (Signed)
Medication Instructions:   *If you need a refill on your cardiac medications before your next appointment, please call your pharmacy*   Lab Work:  If you have labs (blood work) drawn today and your tests are completely normal, you will receive your results only by: MyChart Message (if you have MyChart) OR A paper copy in the mail If you have any lab test that is abnormal or we need to change your treatment, we will call you to review the results.   Testing/Procedures:    Follow-Up: At [facility], you and your health needs are our priority.  As part of our continuing mission to provide you with exceptional heart care, we have created designated Provider Care Teams.  These Care Teams include your primary Cardiologist (physician) and Advanced Practice Providers (APPs -  Physician Assistants and Nurse Practitioners) who all work together to provide you with the care you need, when you need it.  We recommend signing up for the patient portal called "MyChart".  Sign up information is provided on this After Visit Summary.  MyChart is used to connect with patients for Virtual Visits (Telemedicine).  Patients are able to view lab/test results, encounter notes, upcoming appointments, etc.  Non-urgent messages can be sent to your provider as well.   To learn more about what you can do with MyChart, go to [email or web address].    Important Information About Sugar       

## 2022-02-12 ENCOUNTER — Telehealth: Payer: Self-pay

## 2022-02-12 ENCOUNTER — Telehealth: Payer: Self-pay | Admitting: Internal Medicine

## 2022-02-12 NOTE — Telephone Encounter (Signed)
Dr Harrington Challenger' note from 02/10/22 sent to Glennis Brink PA with [facility].

## 2022-02-12 NOTE — Telephone Encounter (Signed)
Sunday Spillers from [facility] is calling stating they received a fax for this patient who they do not have a chart for. She reports Jesse Chase is with [name] and the correct fax number for her is [phone]. Please advise.

## 2022-02-12 NOTE — Telephone Encounter (Signed)
Returned call to [place] for correct fax number for [employer], [place].  Correct fax number is [phone].  Per Sunday Spillers she is now at Dr. Fayrene Fearing office.  Elita Quick for assistance.  Faxed Dr. Harrington Challenger' office note from 02/10/22 to Glennis Brink, PA to number above.

## 2022-02-12 NOTE — Telephone Encounter (Signed)
-----   Message from Fay Records, MD sent at 02/10/2022  7:16 PM EDT ----- Please forward/fax note to K Shepperson.  She is at [employer].   (Her secure chat is when she was with [facility])

## 2022-02-17 DIAGNOSIS — R0602 Shortness of breath: Secondary | ICD-10-CM | POA: Diagnosis not present

## 2022-02-17 DIAGNOSIS — J449 Chronic obstructive pulmonary disease, unspecified: Secondary | ICD-10-CM | POA: Diagnosis not present

## 2022-02-22 DIAGNOSIS — N401 Enlarged prostate with lower urinary tract symptoms: Secondary | ICD-10-CM | POA: Diagnosis not present

## 2022-02-22 DIAGNOSIS — R351 Nocturia: Secondary | ICD-10-CM | POA: Diagnosis not present

## 2022-02-22 DIAGNOSIS — J449 Chronic obstructive pulmonary disease, unspecified: Secondary | ICD-10-CM | POA: Diagnosis not present

## 2022-02-22 DIAGNOSIS — I10 Essential (primary) hypertension: Secondary | ICD-10-CM | POA: Diagnosis not present

## 2022-02-23 ENCOUNTER — Telehealth: Payer: Self-pay | Admitting: Internal Medicine

## 2022-02-23 NOTE — Telephone Encounter (Signed)
Patient called because he noticed on MyChart that Dr. Harrington Challenger noted that Dr. Sarajane Jews was his PCP. Patient stated his PCP is Jesse Chase.  He wants to make sure his records are updated.

## 2022-02-23 NOTE — Telephone Encounter (Signed)
Kirstin Shepperson, PA-C does not pull up as an option for me to add as patient's PCP.

## 2022-02-24 NOTE — Telephone Encounter (Signed)
[employer] [facility] [address]  [place], [place] [address]  Phone [phone] Fax [phone]  Unable to input her... will forward to the [facility] for assistance.

## 2022-05-10 DIAGNOSIS — R351 Nocturia: Secondary | ICD-10-CM | POA: Diagnosis not present

## 2022-05-10 DIAGNOSIS — I10 Essential (primary) hypertension: Secondary | ICD-10-CM | POA: Diagnosis not present

## 2022-05-10 DIAGNOSIS — J449 Chronic obstructive pulmonary disease, unspecified: Secondary | ICD-10-CM | POA: Diagnosis not present

## 2022-05-10 DIAGNOSIS — R0609 Other forms of dyspnea: Secondary | ICD-10-CM | POA: Diagnosis not present

## 2022-05-10 DIAGNOSIS — N401 Enlarged prostate with lower urinary tract symptoms: Secondary | ICD-10-CM | POA: Diagnosis not present

## 2022-06-16 DIAGNOSIS — M1711 Unilateral primary osteoarthritis, right knee: Secondary | ICD-10-CM | POA: Diagnosis not present

## 2022-08-23 ENCOUNTER — Telehealth: Payer: Self-pay | Admitting: Family Medicine

## 2022-08-23 NOTE — Telephone Encounter (Signed)
Pt stated he want to know if dr.Fry will bend the rules and bring him before his new pt appt because he have something that he need for him to address now the patient stated he want a call back.

## 2022-08-24 NOTE — Telephone Encounter (Signed)
The easiest thing to do is to move his appt up to sometime sooner (as long as its 30 minutes it doesn't matter to me)

## 2022-08-24 NOTE — Telephone Encounter (Signed)
New pt appt-09/13/22.

## 2022-08-27 NOTE — Telephone Encounter (Signed)
Spoke with pt regarding Dr Sarajane Jews advise to schedule a 30 minutes appointment since pt has not been seen by Dr Sarajane Jews since 2019. Pt was very upset stated that we need to look into our company policies about how to treat pt who have been with the company for years. Pt declined appointment stated that he will not be coming to see Dr Sarajane Jews anymore stated that he had nothing more to speak to me about and hang- up the phone. Dr Sarajane Jews was notified.

## 2022-09-13 ENCOUNTER — Ambulatory Visit: Payer: Medicare PPO | Admitting: Family Medicine

## 2022-11-03 ENCOUNTER — Encounter: Payer: Self-pay | Admitting: Gastroenterology

## 2023-11-08 ENCOUNTER — Telehealth: Payer: Self-pay | Admitting: Internal Medicine

## 2023-11-08 NOTE — Telephone Encounter (Signed)
 Pt c/o Shortness Of Breath: STAT if SOB developed within the last 24 hours or pt is noticeably SOB on the phone  1. Are you currently SOB (can you hear that pt is SOB on the phone)? No   2. How long have you been experiencing SOB? Months   3. Are you SOB when sitting or when up moving around?  Moving around mostly   4. Are you currently experiencing any other symptoms?  No   Pt called in stating he has been having some SOB lately. He states he referred to Pulmonary and he has COPD but his symptoms aren't getting any better. He is overdue for f/u but he asked if he can have a CT done again. Please advise.

## 2023-11-08 NOTE — Telephone Encounter (Signed)
 Returned call to patient-   Patient states he thought something was wrong with his heart- saw multiple cardiologists before ending up with Dr. Avanell Bob. He states he is unsure of how accurate his previous scan was. He states he has been seeing a pulmonologist for COPD, which is getting better- but his shortness of breath is not- and he thinks it must be his heart. Advised pt since he is overdue for follow up- he will need to be seen before testing can be ordered.   Will route to scheduling team- of note pt ok with NP or PA but would like Dr. Avanell Bob to advise on any testing.

## 2023-11-09 NOTE — Progress Notes (Unsigned)
 Cardiology Office Note:    Date:  11/10/2023   ID:  Jesse Chase, DOB 01/15/1951, MRN [id number]  PCP:  Emaline Handsome, MD  Cardiologist:  Ola Berger, MD     Referring MD: No ref. provider found   Chief Complaint: shortness of breath  History of Present Illness:    Jesse Chase is a 73 y.o. male with a history of coronary artery calcifications with coronary calcium score of 75 in 02/2022, RBBB, hypertension, hyperlipidemia, COPD, and BPH who is followed by Dr. Fabian Holster presents today for evaluation of shortness of breath.  He was previously followed by Cardiology at [facility] but was referred to Dr. Avanell Bob in 02/2022 for further evaluation of self-reported valvular disease.  Prior Myoview in 12/2021 showed no evidence of ischemia or infarct and LVEF of 45%.  Echo that same month showed EF of 55-60%, normal RV function, and no significant valvular disease. A coronary CTA was being considered to definitively rule out CAD Dr. Avanell Bob was going to review this with Dr. Claudina Cullen (his cardiologist at [facility]). Coronary calcium score at [facility] later that month was 75, placing patient between the 25th and 50th percentile for age and sex. Dyspnea was felt to be due to COPD for which he follows with Pulmonology. He has not been seen in our office since then.  Patient called our office on 11/08/2023 with concerns for worsening shortness of breath. Therefore, this visit was arranged.   Patient reports worsening shortness of breath over the last 2 years despite being told that his PFTs are improving.  He plays disc golf and states that 2 years ago he was able to play 18 holes with no problem even when having to walk uphill.  Last year he was able to play 18 holes with no issues as long as he was on a flat surface but now he states he is lucky if he can play 4 holes.  He also is no longer able to mow the grass because of his worsening dyspnea.  He has associated heart racing with the shortness of breath and states it  feels like and "anxiety attack."  During these episodes, he feels like he cannot calm his breathing down.  No shortness of breath at rest.  No orthopnea or PND.  He has some bilateral swelling in his knees but no other lower extremity edema.  No chest pain.  No lightheadedness, dizziness, syncope.  He has a history of remote tobacco use but quit in the 1990s.  He does smoke marijuana daily.  EKGs/Labs/Other Studies Reviewed:    The following studies were reviewed:  Myoview 12/30/2021 [facility]): Impressions: 1. No evidence of infarct or ischemia.  2. Slightly decreased left ventricular ejection fraction of 44%.  3. No focal wall motion abnormalities.  _______________  Echocardiogram 12/30/2021 ([facility]): Impressions: Left Ventricle: Left ventricle size is normal. Left Ventricle: Systolic function is normal. EF: 55-60%.  Right Ventricle: Right ventricle size is normal.  Right Ventricle: Systolic function is normal.  No significant valvular abnormalities.  _______________  CT Cardiac Calcium Scoring ([facility]): Impressions: 1. Total calcium score of 75. This places the patient between the 55 and 50 percentile for age and gender based on the [facility] data base.  2. Sequela of chronc granulomatous disease.    EKG:  EKG ordered today.   EKG Interpretation Date/Time:  Thursday November 10 2023 10:49:25 EDT Ventricular Rate:  87 PR Interval:  142 QRS Duration:  120 QT Interval:  378 QTC Calculation:  454 R Axis:   86  Text Interpretation: Normal sinus rhythm Right bundle branch block Confirmed by Soham Hollett [phone]) on 11/10/2023 10:55:25 AM    Recent Labs: No results found for requested labs within last 365 days.  Recent Lipid Panel    Component Value Date/Time   CHOL 182 06/29/2017 1128   TRIG 109.0 06/29/2017 1128   HDL 60.90 06/29/2017 1128   CHOLHDL 3 06/29/2017 1128   VLDL 21.8 06/29/2017 1128   LDLCALC 99 06/29/2017 1128    Physical Exam:    Vital Signs: BP 120/68    Pulse 87   Ht 6\' 4"  (1.93 m)   Wt 293 lb 8 oz (133.1 kg)   SpO2 93%   BMI 35.73 kg/m     Wt Readings from Last 3 Encounters:  11/10/23 293 lb 8 oz (133.1 kg)  02/10/22 293 lb (132.9 kg)  02/01/19 289 lb (131.1 kg)     General: 73 y.o. Caucasian male in no acute distress. HEENT: Normocephalic and atraumatic. Sclera clear.  Neck: Supple. No carotid bruits. No JVD. Heart: RRR. Distinct S1 and S2. No murmurs, gallops, or rubs.  Lungs: No increased work of breathing. Clear to ausculation bilaterally. No wheezes, rhonchi, or rales.   Extremities: No lower extremity edema.  Skin: Warm and dry. Neuro: No focal deficits. Psych: Normal affect. Responds appropriately.  Assessment:    1. Dyspnea on exertion   2. Coronary artery calcification   3. Hypertension, unspecified type   4. Hyperlipidemia, unspecified hyperlipidemia type     Plan:    Dyspnea on Exertion Patient has a long history of dyspnea on exertion.  Prior cardiac workup in 2023 was unremarkable.  Echo showed normal LV function, RV function, and no significant valvular disease,.  Myoview was negative for any ischemia and coronary calcium score was 75 (25-50th percentile for age and sex).  Looks like dyspnea was primarily felt to be due to his underlying COPD for which she follows with Pulmonology. He has had worsening dyspnea lately. Recent chest CTA in 10/2023 was negative for PE. - He reports worsening dyspnea over the last 2 years despite being told that his COPD is getting better. This is now interfering with his ability to do things he enjoys. - He looks euvolemic on exam and has no other signs or symptoms of CHF. No chest pain. - Will get Echo and coronary CTA. He had a CMET done on 11/08/2023 and creatinine 1.22 (full results in Care Everywhere). Will provide a dose of Lopressor 100mg  to take 2 hours prior to CTA.  Of note, patient states he wants to know the cost of both the Echo and coronary CTA before scheduling these.  Will ask schedulers/ coordinators to assist with this.  Coronary Artery Calcifications Coronary calcium score was 75 (25-50th percentile for age and sex) in 02/2022. Myoview in 12/2021 showed no ischemia or infarction.  - No chest pain.  - He is not currently on aspirin or a statin. Will wait for coronary CTA results to help guide us .  Hypertension History of hypertension but he is not on any medications for this. - BP well controlled.  Hyperlipidemia Lipid panel on 11/08/2023 (Care Everywhere): Total Cholesterol 186, Triglycerides 71, HDL 58, LDL 115. LDL goal <70 given CAD> - Will wait on coronary CTA results to help guide statin therapy.  Disposition: Follow up in 3 months after above studies.   Signed, Fleetwood Pierron E Amr Sturtevant, PA-C  11/10/2023 1:24 PM    [facility]

## 2023-11-10 ENCOUNTER — Encounter: Payer: Self-pay | Admitting: Student

## 2023-11-10 ENCOUNTER — Ambulatory Visit: Attending: Student | Admitting: Student

## 2023-11-10 VITALS — BP 120/68 | HR 87 | Ht 76.0 in | Wt 293.5 lb

## 2023-11-10 DIAGNOSIS — I10 Essential (primary) hypertension: Secondary | ICD-10-CM | POA: Diagnosis not present

## 2023-11-10 DIAGNOSIS — I2510 Atherosclerotic heart disease of native coronary artery without angina pectoris: Secondary | ICD-10-CM | POA: Diagnosis not present

## 2023-11-10 DIAGNOSIS — E785 Hyperlipidemia, unspecified: Secondary | ICD-10-CM | POA: Diagnosis not present

## 2023-11-10 DIAGNOSIS — R0609 Other forms of dyspnea: Secondary | ICD-10-CM | POA: Diagnosis not present

## 2023-11-10 MED ORDER — METOPROLOL TARTRATE 100 MG PO TABS: 100.0000 mg | ORAL_TABLET | Freq: Once | ORAL | 0 refills | Status: AC

## 2023-11-10 NOTE — Patient Instructions (Signed)
 Medication Instructions:  NO CHANGES *If you need a refill on your cardiac medications before your next appointment, please call your pharmacy*  Lab Work: NO LABS If you have labs (blood work) drawn today and your tests are completely normal, you will receive your results only by: MyChart Message (if you have MyChart) OR A paper copy in the mail If you have any lab test that is abnormal or we need to change your treatment, we will call you to review the results.  Testing/Procedures: Your physician has requested that you have an echocardiogram. Echocardiography is a painless test that uses sound waves to create images of your heart. It provides your doctor with information about the size and shape of your heart and how well your heart's chambers and valves are working. This procedure takes approximately one hour. There are no restrictions for this procedure. Please do NOT wear cologne, perfume, aftershave, or lotions (deodorant is allowed). Please arrive 15 minutes prior to your appointment time.  Please note: We ask at that you not bring children with you during ultrasound (echo/ vascular) testing. Due to room size and safety concerns, children are not allowed in the ultrasound rooms during exams. Our front office staff cannot provide observation of children in our lobby area while testing is being conducted. An adult accompanying a patient to their appointment will only be allowed in the ultrasound room at the discretion of the ultrasound technician under special circumstances. We apologize for any inconvenience.      Your cardiac CT will be scheduled at one of the below locations:   Jeralene Mom. [facility] [address]  [place], [place] [address] Opening October 03, 2023     If scheduled at the Heart and Vascular Tower at [employer], please enter the parking lot using the [address] street entrance and use the FREE valet service at the patient drop-off area. Enter  the buidling and check-in with registration on the main floor.  Please follow these instructions carefully (unless otherwise directed):  An IV will be required for this test and Nitroglycerin will be given.  Hold all erectile dysfunction medications at least 3 days (72 hrs) prior to test. (Ie viagra, cialis, sildenafil, tadalafil, etc)   On the Night Before the Test: Be sure to Drink plenty of water. Do not consume any caffeinated/decaffeinated beverages or chocolate 12 hours prior to your test. Do not take any antihistamines 12 hours prior to your test.  On the Day of the Test: Drink plenty of water until 1 hour prior to the test. Do not eat any food 1 hour prior to test. You may take your regular medications prior to the test.  Take metoprolol (Lopressor) 100 mg one time dose two hours prior to test. If you take Furosemide/Hydrochlorothiazide/Spironolactone/Chlorthalidone, please HOLD on the morning of the test. Patients who wear a continuous glucose monitor MUST remove the device prior to scanning. FEMALES- please wear underwire-free bra if available, avoid dresses & tight clothing       After the Test: Drink plenty of water. After receiving IV contrast, you may experience a mild flushed feeling. This is normal. On occasion, you may experience a mild rash up to 24 hours after the test. This is not dangerous. If this occurs, you can take Benadryl 25 mg, Zyrtec, Claritin, or Allegra and increase your fluid intake. (Patients taking Tikosyn should avoid Benadryl, and may take Zyrtec, Claritin, or Allegra) If you experience trouble breathing, this can be serious. If it  is severe call 911 IMMEDIATELY. If it is mild, please call our office.  We will call to schedule your test 2-4 weeks out understanding that some insurance companies will need an authorization prior to the service being performed.   For more information and frequently asked questions, please visit our website :  [email or web address]  For non-scheduling related questions, please contact the cardiac imaging nurse navigator should you have any questions/concerns: Cardiac Imaging Nurse Navigators Direct Office Dial: [phone]   For scheduling needs, including cancellations and rescheduling, please call [place], [phone].   Follow-Up: At [facility], you and your health needs are our priority.  As part of our continuing mission to provide you with exceptional heart care, our providers are all part of one team.  This team includes your primary Cardiologist (physician) and Advanced Practice Providers or APPs (Physician Assistants and Nurse Practitioners) who all work together to provide you with the care you need, when you need it.  Your next appointment:   3 month(s) after test procedures  Provider:   Callie Goodrich, PA-C

## 2023-11-22 ENCOUNTER — Encounter (HOSPITAL_COMMUNITY): Payer: Self-pay

## 2023-11-23 ENCOUNTER — Telehealth: Payer: Self-pay | Admitting: Internal Medicine

## 2023-11-23 NOTE — Telephone Encounter (Signed)
 Patient wants a call back directly to specifically discuss his cost to do CT Coronary Morphology test.

## 2023-11-24 ENCOUNTER — Ambulatory Visit (HOSPITAL_COMMUNITY)

## 2023-12-14 ENCOUNTER — Ambulatory Visit (HOSPITAL_COMMUNITY)
Admission: RE | Admit: 2023-12-14 | Discharge: 2023-12-14 | Disposition: A | Discharge status: Home / Self Care | Source: Ambulatory Visit | Attending: Student | Admitting: Student

## 2023-12-14 DIAGNOSIS — R0609 Other forms of dyspnea: Secondary | ICD-10-CM | POA: Insufficient documentation

## 2023-12-14 DIAGNOSIS — I2510 Atherosclerotic heart disease of native coronary artery without angina pectoris: Secondary | ICD-10-CM | POA: Insufficient documentation

## 2023-12-14 MED ORDER — NITROGLYCERIN 0.4 MG SL SUBL
0.8000 mg | SUBLINGUAL_TABLET | Freq: Once | SUBLINGUAL | Status: AC
  Administered 2023-12-14: 0.8 mg via SUBLINGUAL

## 2023-12-14 MED ORDER — IOHEXOL 350 MG/ML SOLN
100.0000 mL | Freq: Once | INTRAVENOUS | Status: AC | PRN
  Administered 2023-12-14: 100 mL via INTRAVENOUS

## 2023-12-17 ENCOUNTER — Ambulatory Visit: Payer: Self-pay | Admitting: Student

## 2023-12-17 DIAGNOSIS — Z79899 Other long term (current) drug therapy: Secondary | ICD-10-CM

## 2023-12-21 NOTE — Telephone Encounter (Signed)
 Pt is returning call to nurse for results

## 2023-12-22 MED ORDER — ROSUVASTATIN CALCIUM 20 MG PO TABS: 20.0000 mg | ORAL_TABLET | Freq: Every day | ORAL | 3 refills | Status: AC

## 2023-12-22 MED ORDER — ASPIRIN 81 MG PO TBEC: 81.0000 mg | DELAYED_RELEASE_TABLET | Freq: Every day | ORAL | Status: AC

## 2023-12-28 ENCOUNTER — Ambulatory Visit (HOSPITAL_COMMUNITY)
Admission: RE | Admit: 2023-12-28 | Discharge: 2023-12-28 | Disposition: A | Discharge status: Home / Self Care | Source: Ambulatory Visit | Attending: Student | Admitting: Student

## 2023-12-28 DIAGNOSIS — R0609 Other forms of dyspnea: Secondary | ICD-10-CM | POA: Diagnosis present

## 2023-12-28 LAB — ECHOCARDIOGRAM COMPLETE
AR max vel: 2.4 cm2
AV Area VTI: 2.43 cm2
AV Area mean vel: 2.39 cm2
AV Mean grad: 4 mmHg
AV Peak grad: 7.7 mmHg
Ao pk vel: 1.39 m/s
Area-P 1/2: 3.56 cm2
S' Lateral: 2.48 cm

## 2024-01-02 ENCOUNTER — Telehealth: Payer: Self-pay | Admitting: Internal Medicine

## 2024-01-02 DIAGNOSIS — E785 Hyperlipidemia, unspecified: Secondary | ICD-10-CM

## 2024-01-02 DIAGNOSIS — R0609 Other forms of dyspnea: Secondary | ICD-10-CM

## 2024-01-02 DIAGNOSIS — I2510 Atherosclerotic heart disease of native coronary artery without angina pectoris: Secondary | ICD-10-CM

## 2024-01-02 NOTE — Telephone Encounter (Signed)
Patient wants a call back to discuss test results.

## 2024-01-02 NOTE — Telephone Encounter (Signed)
 Would recomm setting up for stress echo  Evaluate SOB  Evaluate heart rate and BP responses  Pt does not want to receive message in portal any longer for test results   Wants call  Will need follow up labs  (NMR panel, liver panel, Lpa and Apo B)   Wants call after 9 am tomorrow

## 2024-01-02 NOTE — Telephone Encounter (Signed)
 Trivial mitral valve  regurgitation.  Tricuspid  valve regurgitation is trivial.  There is mild calcification  of the aortic valve.  Pulmonic valve  regurgitation is trivial.  #3 is what is worrying him: 3. The mitral valve is abnormal. Trivial mitral valve regurgitation. No  evidence of mitral stenosis.    He feels that all of the symptoms that he is experiencing are related to mitral valve regurgitation. He looked it up and his symptoms are exactly what are listed. He gets short of breath by barely doing anything. He gets so short of breath that it feels like a panic attack. He can hardly do any activity.   He does not think that MyChart is a good form of communication. He is pi#$ed off- (he asked me to put this) He needs someone to actually explain the results. He does not agree that it is not related to his heart because he looked/read the information in the echo, then looked up the symptoms that are caused by this and it is exactly what he has been experiencing.   He was worried all weekend that something was horribly wrong with his heart. I asked him if he read Callie's summary of the results. And he started to get angry about how many notifications he gets about MyChart. He says that he read the results, but I don't think he read the provider's summary of results.   I also stressed to him that these results say trivial which means minimal, which is a good thing.   He just stressed that he thinks his s/s are from the valvular dysfunction and he wants someone to CALL him and talk to him about his results.

## 2024-01-03 ENCOUNTER — Telehealth (HOSPITAL_COMMUNITY): Payer: Self-pay

## 2024-01-03 ENCOUNTER — Telehealth: Payer: Self-pay

## 2024-01-03 ENCOUNTER — Encounter (HOSPITAL_COMMUNITY): Payer: Self-pay

## 2024-01-03 DIAGNOSIS — R0600 Dyspnea, unspecified: Secondary | ICD-10-CM

## 2024-01-03 NOTE — Telephone Encounter (Signed)
 Spoke to patient Dr.Ross's advice given.Scheduler will call back to schedule stress echo.Stated he will start Crestor  20 mg daily 8/1.Advised to have fasting lab below in 3 months.Lab orders mailed.

## 2024-01-03 NOTE — Telephone Encounter (Signed)
 Message sent to Dr.Ross to sign attestation order for stress echo.

## 2024-01-03 NOTE — Telephone Encounter (Signed)
 Yes I ordered a stress echo.You will need to sign a attestation order for the stress portion.

## 2024-01-03 NOTE — Telephone Encounter (Signed)
 Attempted to contact this patient to give him instructions for his Stress Echo test on Friday January 06, 2024. He answered the phone and was very rude. He stated that this was the 3rd phone call from [facility] today and he was trying to sleep. He told me to email it to him, I stated that we don't do email. He raised his voice told me to call him later and hung up. His instructions will be sent via my chart and a letter sent. S.Stasha Naraine CCT

## 2024-01-05 ENCOUNTER — Ambulatory Visit (HOSPITAL_COMMUNITY)

## 2024-01-06 ENCOUNTER — Ambulatory Visit (HOSPITAL_COMMUNITY)
Admission: RE | Admit: 2024-01-06 | Discharge: 2024-01-06 | Disposition: A | Discharge status: Home / Self Care | Source: Ambulatory Visit | Attending: Cardiology | Admitting: Cardiology

## 2024-01-06 DIAGNOSIS — R0609 Other forms of dyspnea: Secondary | ICD-10-CM | POA: Diagnosis present

## 2024-01-06 MED ORDER — PERFLUTREN LIPID MICROSPHERE
6.0000 mL | INTRAVENOUS | Status: DC | PRN
  Administered 2024-01-06: 6 mL via INTRAVENOUS

## 2024-01-11 ENCOUNTER — Ambulatory Visit: Payer: Self-pay | Admitting: Internal Medicine

## 2024-01-18 ENCOUNTER — Telehealth (HOSPITAL_COMMUNITY): Payer: Self-pay

## 2024-01-18 NOTE — Telephone Encounter (Signed)
 Pt insurance is active and benefits verified through [facility] Co-pay $20, DED 0/0 met, out of pocket $3,150/$503.44 met, co-insurance 0%. no pre-authorization required, 01/18/2024@11 :26, REF# [id number]  72 VISIT LIMIT PER LIFETIME 0/72 USED

## 2024-01-20 ENCOUNTER — Telehealth (HOSPITAL_COMMUNITY): Payer: Self-pay

## 2024-01-20 NOTE — Telephone Encounter (Signed)
 Called patient regarding pulmonary rehab at 9:48am, patient was very upset that we called him before 10am. Patient claims someone talked to him Friday (I assume he means last Friday 8/08) regarding pulmonary rehab and he said he just wants a brochure mailed to him and he will decide if he wants to attend the program from there but he refused to discuss it over the phone. Was unable to go over program with patient. Confirmed that no staff called him from our office regarding pulmonary rehab, unsure if maybe someone from [place] had called him as his is an outside referral.  Mailing brochure. If patient is interested he can contact us  and we will reopen his referral.  Closing referral.

## 2024-04-11 LAB — NMR, LIPOPROFILE
Cholesterol, Total: 118 mg/dL (ref 100–199)
HDL Particle Number: 36.8 umol/L (ref >=30.5)
HDL-C: 57 mg/dL (ref >39)
LDL Particle Number: 476 nmol/L (ref <1000)
LDL Size: 20 nm — AB (ref >20.5)
LDL-C (NIH Calc): 45 mg/dL (ref 0–99)
LP-IR Score: 31 (ref <=45)
Small LDL Particle Number: 268 nmol/L (ref <=527)
Triglycerides: 82 mg/dL (ref 0–149)

## 2024-04-11 LAB — LIPOPROTEIN A (LPA): Lipoprotein (a): 34.6 nmol/L (ref <75.0)

## 2024-04-11 LAB — HEPATIC FUNCTION PANEL
ALT: 15 IU/L (ref 0–44)
AST: 16 IU/L (ref 0–40)
Albumin: 4.3 g/dL (ref 3.8–4.8)
Alkaline Phosphatase: 99 IU/L (ref 47–123)
Bilirubin Total: 0.7 mg/dL (ref 0.0–1.2)
Bilirubin, Direct: 0.27 mg/dL (ref 0.00–0.40)
Total Protein: 6.9 g/dL (ref 6.0–8.5)

## 2024-04-11 LAB — LIPID PANEL+APOB
Apolipoprotein B: 53 mg/dL (ref <90)
Non-HDL Cholesterol: 61 mg/dL (ref 0–129)

## 2024-06-28 ENCOUNTER — Telehealth (HOSPITAL_COMMUNITY): Payer: Self-pay

## 2024-06-28 NOTE — Telephone Encounter (Signed)
 Received referral from [facility] for [facility]monary rehab.  Call patient to see if he would be interested.  Pt asked if I would check insurance before he commits to rehab.  Advised I would check and then he could give me an answer.  Referral scanned to [facility]. Faxed request for Dr's order and signature.

## 2024-07-10 ENCOUNTER — Telehealth (HOSPITAL_COMMUNITY): Payer: Self-pay

## 2024-07-10 NOTE — Telephone Encounter (Signed)
 Called pt to see if he is still interested in the pulmonary rehab he was referred to from the [facility]. I provided pt with the benefits for the pulmonary rehab program. Pt stated that he will call back if he decides to do the program. Will place pt in the 2 week f/u.
